# Patient Record
Sex: Male | Born: 1960 | Race: White | Hispanic: No | Marital: Married | Smoking: Never smoker
Health system: Southern US, Community
[De-identification: ages and names within clinical notes are randomized; demographics above are authoritative.]

## PROBLEM LIST (undated history)

## (undated) DIAGNOSIS — N400 Enlarged prostate without lower urinary tract symptoms: Secondary | ICD-10-CM

## (undated) DIAGNOSIS — R251 Tremor, unspecified: Secondary | ICD-10-CM

## (undated) DIAGNOSIS — K219 Gastro-esophageal reflux disease without esophagitis: Secondary | ICD-10-CM

## (undated) DIAGNOSIS — I10 Essential (primary) hypertension: Secondary | ICD-10-CM

## (undated) DIAGNOSIS — F419 Anxiety disorder, unspecified: Secondary | ICD-10-CM

## (undated) HISTORY — DX: Benign prostatic hyperplasia without lower urinary tract symptoms: N40.0

## (undated) HISTORY — DX: Gastro-esophageal reflux disease without esophagitis: K21.9

## (undated) HISTORY — DX: Anxiety disorder, unspecified: F41.9

## (undated) HISTORY — DX: Essential (primary) hypertension: I10

## (undated) HISTORY — DX: Tremor, unspecified: R25.1

---
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
## 2001-12-04 DIAGNOSIS — N401 Enlarged prostate with lower urinary tract symptoms: Secondary | ICD-10-CM | POA: Insufficient documentation

## 2004-05-05 DIAGNOSIS — I10 Essential (primary) hypertension: Secondary | ICD-10-CM | POA: Insufficient documentation

## 2005-11-20 ENCOUNTER — Ambulatory Visit: Payer: Self-pay | Admitting: Unknown Physician Specialty

## 2009-11-05 ENCOUNTER — Ambulatory Visit: Payer: Self-pay | Admitting: Unknown Physician Specialty

## 2009-11-05 LAB — HM COLONOSCOPY

## 2011-06-23 ENCOUNTER — Ambulatory Visit: Payer: Self-pay | Admitting: Family Medicine

## 2012-07-02 ENCOUNTER — Ambulatory Visit: Payer: Self-pay | Admitting: General Practice

## 2013-02-16 IMAGING — CR DG CHEST 1V
1 series · 1 of 1 positions shown · non-contrast
Comparison: none

REASON FOR EXAM: Positive QFG TB-Test Send report to [REDACTED]
[phone]
COMMENTS:

[pa]
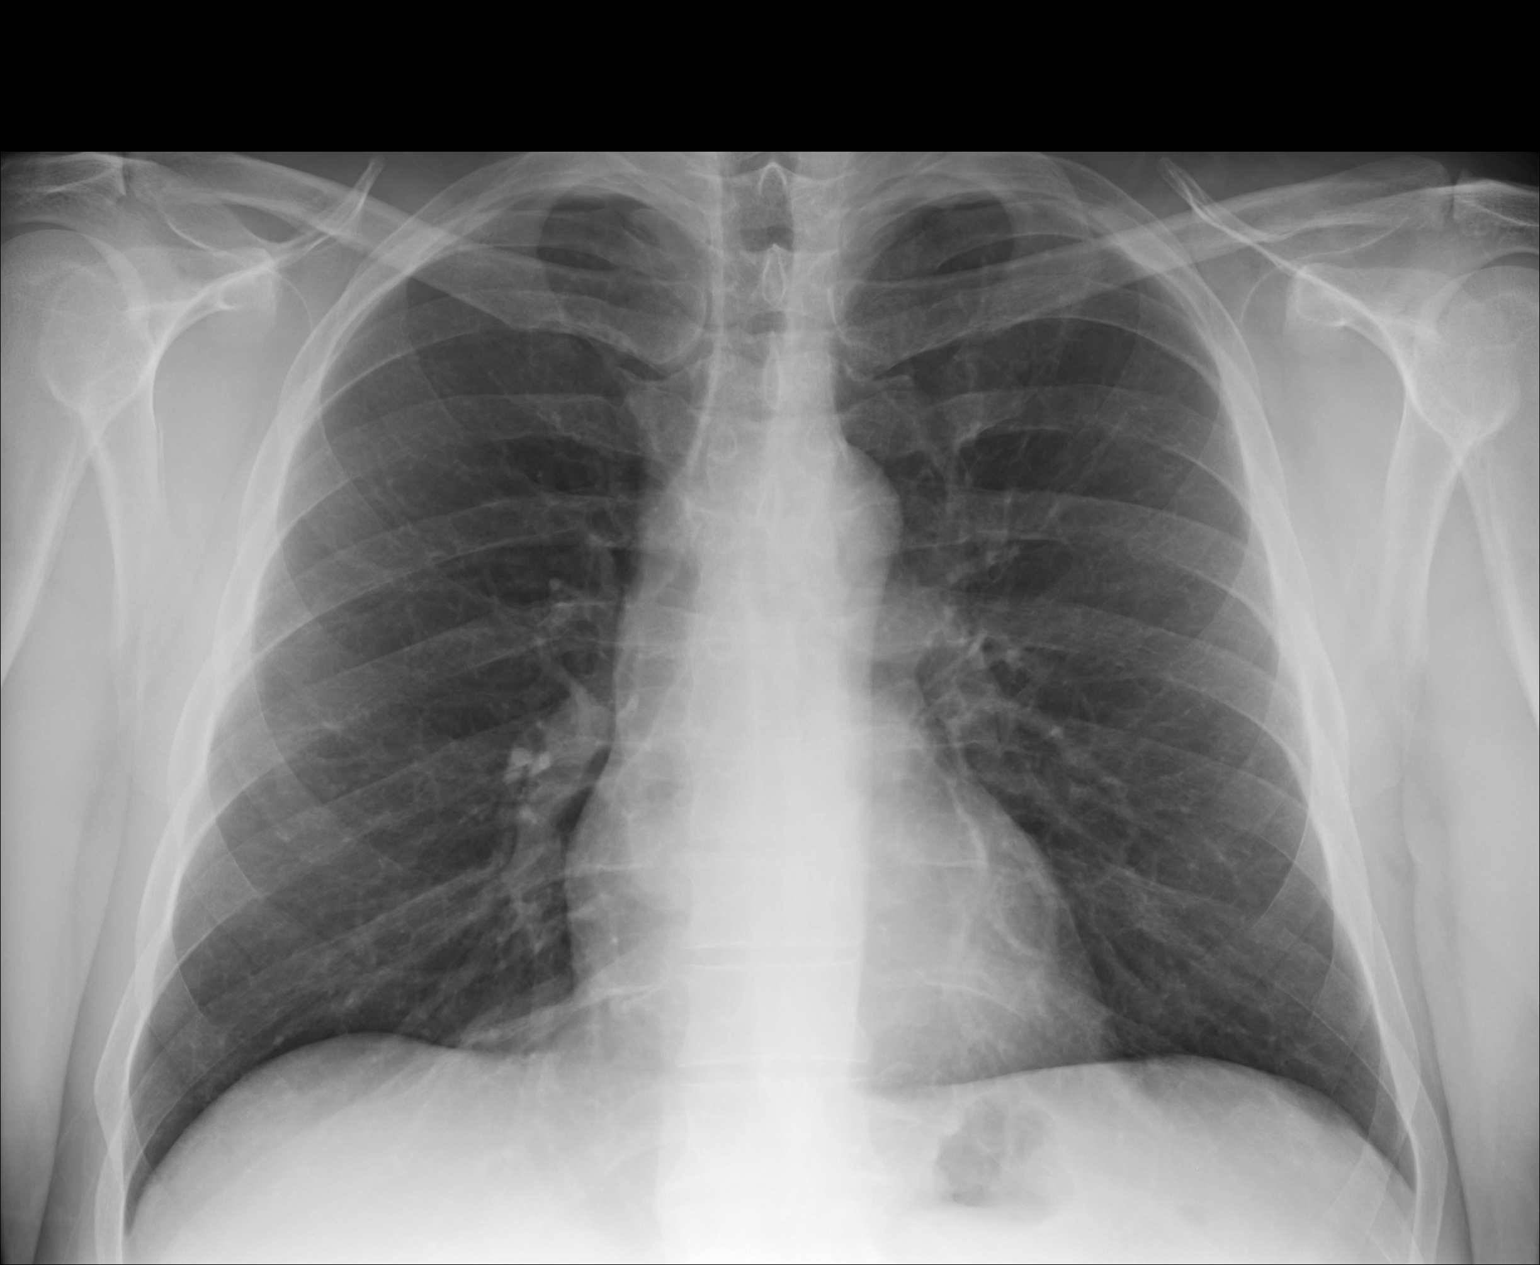

[1 of 1 positions shown; findings below may reference images not displayed]

PROCEDURE:     DXR - DXR CHEST 1 VIEWAP OR PA  - July 02, 2012 [DATE]

RESULT:     The lungs are well-expanded and clear. The cardiac silhouette is
normal in size. The mediastinum is normal in width. There is no pleural
effusion. No pulmonary parenchymal masses are demonstrated and there is no
evidence of calcified granulomas.
IMPRESSION: Normal chest x-ray.

[REDACTED]

## 2015-01-26 ENCOUNTER — Observation Stay: Payer: Self-pay | Admitting: Surgery

## 2015-01-26 HISTORY — PX: CHOLECYSTECTOMY: SHX55

## 2015-02-02 HISTORY — PX: GALLBLADDER SURGERY: SHX652

## 2015-02-12 LAB — LIPID PANEL
Cholesterol: 169 mg/dL (ref 0–200)
HDL: 41 mg/dL (ref 35–70)
LDL CALC: 104 mg/dL
TRIGLYCERIDES: 119 mg/dL (ref 40–160)

## 2015-02-12 LAB — PSA: PSA: 1.1

## 2015-03-29 LAB — SURGICAL PATHOLOGY

## 2015-04-04 NOTE — Op Note (Signed)
PATIENT NAME:  Ryan Mcmahon, [name] MR#:  [id number] DATE OF BIRTH:  1961-09-16  DATE OF PROCEDURE:  01/26/2015  PREOPERATIVE DIAGNOSIS: Acute cholecystitis.   POSTOPERATIVE DIAGNOSIS:  Acute cholecystitis.  PROCEDURE PERFORMED: Laparoscopic cholecystectomy.   ANESTHESIA: General.   ESTIMATED BLOOD LOSS: 15 mL's.    COMPLICATIONS: None.   SPECIMENS: Gallbladder.   INDICATION FOR SURGERY: Mr. Katrinka BlazingSmith is a pleasant 54 year old male who presents with right upper quadrant pain, leukocytosis and ultrasound suggests the cholecystitis. He was brought to the operating room for cholecystectomy.   DETAILS OF PROCEDURE: Appropriate consent was obtained and Mr. Katrinka BlazingSmith was brought to the operating room where he was induced, endotracheal tube was placed, general anesthesia was administered. His abdomen was prepped and draped in the standard surgical fashion. A timeout was then performed to correct identify the patient name, operative site and procedure to be performed. A supraumbilical incision was made and deepened down to the fascia. The fascia was incised. Peritoneum was entered. Two stay sutures were placed in the fasciotomy. A Hassan trocar was placed in the abdomen. The abdomen was insufflated under direct vision. An 11 mm epigastric and 2 right subcostal trocars were placed at the midclavicular and anterior axillary line. The gallbladder was then visualized. It was distended with inflammation. It had a few adhesions, however, was lifted over the dome of the liver. The cystic duct and cystic artery were dissected out so the critical view was obtained. These structures were clipped and ligated. The gallbladder was then taken off the gallbladder wall with electrocautery. There was a large amount of edema. The gallbladder was then taken out through the umbilical port with an Endo Catch bag. After I was happy with hemostasis, all trocars were removed under direct visualization. The supraumbilical trocar was closed with  a figure-of-8 and 0 Vicryl. All skin sites were then closed with 4-0 Monocryl. Deep dermal sutures, Steri-Strips, Telfa gauze and Tegaderm were used to complete the dressing. The patient was then awoken and brought to the postanesthesia care unit. There were no immediate complications. Needle, sponge, and instrument counts were correct at the end of the procedure.    ____________________________ Si Raiderhristopher A. Shelena Castelluccio, MD cal:mc D: 01/27/2015 22:02:00 ET T: 01/28/2015 12:44:28 ET JOB#: [id number]  cc: Cristal Deerhristopher A. Ryen Heitmeyer, MD, <Dictator> Jarvis NewcomerHRISTOPHER A Libi Corso MD ELECTRONICALLY SIGNED 02/09/2015 11:24

## 2015-04-04 NOTE — H&P (Signed)
Subjective/Chief Complaint epigastric and RUQ abdominal pain   History of Present Illness 54 y/o male with HTN presents with second episode of severe epigastric and RUQ abdominal pain radiating into his right back within the last week.  One week ago developed similar but not as intense pain around 10 pm lasting 2-3 hours which resolved on own and then he was asymptomatic for 1 week.  This happened following eating chicken casserole.  Last night around the same time developed similar type upper abdominal pain which was much more intense characterized by sharp pains and mild nausea.  Self induced emesis times one.  No sick contacts, no fevers, no jaundice.  He denies prior episodes of such pain.  No family history of gallstones.   Work-up in ER shows stones lodged in GB neck and positive murphy's sign.  Since admission feels better following IVF, IV abx and IV narcotics.   Only past surgical history is tonsillectomy.   Past History hypertension   Past Medical Health Hypertension   Code Status Full Code   Past Med/Surgical Hx:  Tonsillectomy:   ALLERGIES:  No Known Allergies:    Other Allergies none   HOME MEDICATIONS: Medication Instructions Status  verapamil 24 hour extended release 300 milligram(s) orally once a day Active  hydrochlorothiazide-lisinopril 12.5 mg-20 mg oral tablet 1 tab(s) orally once a day Active  meloxicam 15 mg oral tablet 1 tab(s) orally once a day Active  omeprazole 20 mg oral delayed release tablet 1 tab(s) orally once a day Active   Family and Social History:  Family History negative for gallstones   Social History positive  tobacco (Current within 1 year), positive ETOH, negative Illicit drugs, smokeless tobacco and beer.   + Tobacco Current (within 1 year)   Place of Living Home   Review of Systems:  Subjective/Chief Complaint see above, remaining 10 review is negative.   Abdominal Pain Yes   Nausea/Vomiting Yes   SOB/DOE No   Chest Pain  No   Tolerating Diet No  Nauseated   Medications/Allergies Reviewed Medications/Allergies reviewed   Physical Exam:  GEN no acute distress, disheveled, temp 94.5 p 68 bp 136/84 rr 16/minute.   HEENT pale conjunctivae, PERRL, hearing intact to voice   NECK supple  No masses   RESP normal resp effort  clear BS   CARD regular rate   ABD positive tenderness  no hernia  soft  tender RUQ and epigastrum.   LYMPH negative neck   EXTR negative cyanosis/clubbing   SKIN normal to palpation, No rashes   NEURO cranial nerves intact   PSYCH A+O to time, place, person, good insight   Lab Results: Hepatic:  23-Feb-16 00:31   Bilirubin, Total 0.3  Alkaline Phosphatase  120  SGPT (ALT) 25  SGOT (AST) 24  Total Protein, Serum 7.7  Albumin, Serum 4.1  Routine Chem:  23-Feb-16 00:31   Glucose, Serum  114  BUN 16  Creatinine (comp)  1.31  Sodium, Serum 141  Potassium, Serum 3.6  Chloride, Serum 104  CO2, Serum 31  Calcium (Total), Serum 8.7  Osmolality (calc) 283  eGFR (African American) >60  eGFR (Non-African American) >60 (eGFR values <37m/min/1.73 m2 may be an indication of chronic kidney disease (CKD). Calculated eGFR, using the MRDR Study equation, is useful in  patients with stable renal function. The eGFR calculation will not be reliable in acutely ill patients when serum creatinine is changing rapidly. It is not useful in patients on dialysis. The eGFR calculation  may not be applicable to patients at the low and high extremes of body sizes, pregnant women, and vegetarians.)  Anion Gap  6  Lipase 107 (Result(s) reported on 26 Jan 2015 at 01:13AM.)  Cardiac:  23-Feb-16 00:31   Troponin I < 0.02 (0.00-0.05 0.05 ng/mL or less: NEGATIVE  Repeat testing in 3-6 hrs  if clinically indicated. >0.05 ng/mL: POTENTIAL  MYOCARDIAL INJURY. Repeat  testing in 3-6 hrs if  clinically indicated. NOTE: An increase or decrease  of 30% or more on serial  testing suggests a   clinically important change)  Routine Hem:  23-Feb-16 00:31   WBC (CBC) 10.3  RBC (CBC) 4.86  Hemoglobin (CBC) 15.3  Hematocrit (CBC) 45.2  Platelet Count (CBC) 251  MCV 93  MCH 31.6  MCHC 33.9  RDW 13.0  Neutrophil % 56.1  Lymphocyte % 32.3  Monocyte % 9.0  Eosinophil % 1.9  Basophil % 0.7  Neutrophil # 5.8  Lymphocyte # 3.3  Monocyte # 0.9  Eosinophil # 0.2  Basophil # 0.1 (Result(s) reported on 26 Jan 2015 at 01:04AM.)   Radiology Results: [place]:    23-Feb-16 02:09, US Abdomen Limited Survey  US Abdomen Limited Survey  REASON FOR EXAM:    pain, epigastic.  last week had similar in RUQ.    Nausea.  COMMENTS:   Body Site: Gallbladder, Liver, Common Bile Duct    PROCEDURE: [place]  - US ABDOMEN LIMITED SURVEY  - Jan 26 2015  2:09AM     CLINICAL DATA:  Epigastric pain.  Nausea.  Similar pain last week.    EXAM:  US ABDOMEN LIMITED - RIGHT UPPER QUADRANT    COMPARISON:  None.    FINDINGS:  Gallbladder:  Joaquim Lai appears lodged in the gallbladder neck. Sludge and mild  distention of the gallbladder. Positive Murphy's sign. Findings are  consistent with acute cholecystitis in the appropriate clinical  setting.    Common bile duct:    Diameter: 4.5 mm, normal    Liver:    No focal lesion identified. Within normal limits in parenchymal  echogenicity.     IMPRESSION:  Cholelithiasis and sludge with positive Murphy's sign. Findings  consistent with acute cholecystitis.      Electronically Signed    By: Lucienne Capers M.D.    On: 01/26/2015 02:13         Verified By: Neale Burly, M.D.,  LabUnknown:  PACS Image    Assessment/Admission Diagnosis 54 y/o male with HTN, cholelithiasis and acute calculus cholecystitits Currently feels better but still with significant discomfort   Plan Admit, IV abx, IV pain meds and anti-emetics. Discussed with him laparoscopic cholecystectomy with risks of open procedure, ERCP, bile duct injury (1:200), bleeding, bowel  injury and infection.  Wishes to proceed with surgery Plan later today pending OR schedule availability. All questions addressed.  Will discuss with Drs. Marterre and [employer].   Electronic Signatures: Sherri Rad (MD)  (Signed [phone] 07:14)  Authored: CHIEF COMPLAINT and HISTORY, PAST MEDICAL/SURGIAL HISTORY, ALLERGIES, Other Allergies, HOME MEDICATIONS, FAMILY AND SOCIAL HISTORY, REVIEW OF SYSTEMS, PHYSICAL EXAM, LABS, Radiology, ASSESSMENT AND PLAN   Last Updated: 23-Feb-16 07:14 by Sherri Rad (MD)

## 2015-11-05 ENCOUNTER — Other Ambulatory Visit: Payer: Self-pay | Admitting: Family Medicine

## 2015-11-05 NOTE — Telephone Encounter (Signed)
Please call in alprazolam.  

## 2015-11-05 NOTE — Telephone Encounter (Signed)
[facility]

## 2016-02-03 ENCOUNTER — Other Ambulatory Visit: Payer: Self-pay | Admitting: Family Medicine

## 2016-02-03 DIAGNOSIS — M542 Cervicalgia: Secondary | ICD-10-CM | POA: Insufficient documentation

## 2016-02-03 DIAGNOSIS — E669 Obesity, unspecified: Secondary | ICD-10-CM | POA: Insufficient documentation

## 2016-02-03 DIAGNOSIS — G250 Essential tremor: Secondary | ICD-10-CM | POA: Insufficient documentation

## 2016-02-03 DIAGNOSIS — R202 Paresthesia of skin: Secondary | ICD-10-CM | POA: Insufficient documentation

## 2016-02-03 DIAGNOSIS — N529 Male erectile dysfunction, unspecified: Secondary | ICD-10-CM | POA: Insufficient documentation

## 2016-02-03 DIAGNOSIS — E291 Testicular hypofunction: Secondary | ICD-10-CM | POA: Insufficient documentation

## 2016-02-08 ENCOUNTER — Ambulatory Visit
Admission: RE | Admit: 2016-02-08 | Discharge: 2016-02-08 | Disposition: A | Discharge status: Home / Self Care | Payer: Managed Care, Other (non HMO) | Source: Ambulatory Visit | Attending: Family Medicine | Admitting: Family Medicine

## 2016-02-08 ENCOUNTER — Ambulatory Visit (INDEPENDENT_AMBULATORY_CARE_PROVIDER_SITE_OTHER): Payer: Managed Care, Other (non HMO) | Admitting: Family Medicine

## 2016-02-08 ENCOUNTER — Encounter: Payer: Self-pay | Admitting: Family Medicine

## 2016-02-08 VITALS — BP 130/76 | HR 74 | Temp 98.2°F | Resp 16 | Ht 73.0 in | Wt 227.0 lb

## 2016-02-08 DIAGNOSIS — Z0000 Encounter for general adult medical examination without abnormal findings: Secondary | ICD-10-CM

## 2016-02-08 DIAGNOSIS — I10 Essential (primary) hypertension: Secondary | ICD-10-CM | POA: Diagnosis not present

## 2016-02-08 DIAGNOSIS — L570 Actinic keratosis: Secondary | ICD-10-CM | POA: Diagnosis not present

## 2016-02-08 DIAGNOSIS — M25572 Pain in left ankle and joints of left foot: Secondary | ICD-10-CM

## 2016-02-08 DIAGNOSIS — G250 Essential tremor: Secondary | ICD-10-CM | POA: Diagnosis not present

## 2016-02-08 DIAGNOSIS — R739 Hyperglycemia, unspecified: Secondary | ICD-10-CM

## 2016-02-08 MED ORDER — VERAPAMIL HCL ER 180 MG PO CP24: 180.0000 mg | ORAL_CAPSULE | Freq: Every day | ORAL | Status: DC

## 2016-02-08 MED ORDER — PROPRANOLOL HCL ER 80 MG PO CP24: 80.0000 mg | ORAL_CAPSULE | Freq: Every day | ORAL | Status: DC

## 2016-02-08 NOTE — Patient Instructions (Addendum)
Please schedule a follow up visit with your dermatologist in the near future.   Go to the [facility] on [address] for left ankle Xray

## 2016-02-08 NOTE — Progress Notes (Signed)
Patient: Ryan Mcmahon, Male    DOB: Jan 14, 1961, 55 y.o.   MRN: [id number] Visit Date: 02/08/2016  Today's Provider: Mila Merry, MD   Chief Complaint  Patient presents with  . Annual Exam  . Hypertension  . Anxiety   Subjective:    Annual physical exam Ryan Mcmahon is a 55 y.o. male who presents today for health maintenance and complete physical. He feels fairly well. He reports exercising some. He reports he is sleeping fairly well.  -----------------------------------------------------------------    Follow-up for anxiety from 02/02/2015. Taking occasional alprazolam which works well when he does take it    Hypertension, follow-up:  BP Readings from Last 3 Encounters:  02/08/16 130/76  02/02/15 122/80    He was last seen for hypertension 1 years ago.  BP at that visit was 122/80. Management since that visit includes; no changes.He reports good compliance with treatment. He is not having side effects. none  He is exercising. He is adherent to low salt diet.   Outside blood pressures are 130/75. He is experiencing none.  Patient denies none.   Cardiovascular risk factors include none.  Use of agents associated with hypertension: none.   ----------------------------------------------------------------------   Ankle pain He started having pain and swelling left lateral ankle about 6 months ago. He thinks he may have strained it playing golf, but doesn't remember a specific incident. It has improved, but still flares up periodically and bother's him when he walks for long periods or golfs. He occasionally wears and elastic ankle brace, but does not use it consistently.    Essential tremor He has had mild tremor with use of hands for several years which runs in his family. There is no tremor at rest. Was prescribed mysoline several years but he doesn't remember how it worked. He would like to try propranolol as tremor is getting a little worse with time. .    Review of Systems  Constitutional: Negative.   HENT: Negative.   Eyes: Negative.   Respiratory: Negative.   Cardiovascular: Negative.   Gastrointestinal: Negative.   Endocrine: Negative.   Genitourinary: Negative.   Musculoskeletal:       Right foot/ankle pain 6 months ago  Skin: Negative.   Allergic/Immunologic: Negative.   Neurological: Positive for tremors.  Psychiatric/Behavioral: The patient is nervous/anxious.     Social History      He  reports that he has never smoked. His smokeless tobacco use includes Chew. He reports that he drinks alcohol. He reports that he does not use illicit drugs.       Social History   Social History  . Marital Status: Married    Spouse Name: N/A  . Number of Children: 2  . Years of Education: N/A   Social History Main Topics  . Smoking status: Never Smoker   . Smokeless tobacco: Current User    Types: Chew  . Alcohol Use: 0.0 oz/week    0 Standard drinks or equivalent per week     Comment: occasional use  . Drug Use: No  . Sexual Activity: Not Asked   Other Topics Concern  . None   Social History Narrative    History reviewed. No pertinent past medical history.   Patient Active Problem List   Diagnosis Date Noted  . 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 Hypogonadism male 02/03/2016  . Left hand paresthesia 02/03/2016  . Neck pain 02/03/2016  . Obesity 02/03/2016  . Benign  essential tremor 02/03/2016  . Anxiety disorder 01/22/2008  . Essential (primary) hypertension 05/05/2004  . GERD (gastroesophageal reflux disease) 05/05/2002  . Benign prostatic hypertrophy without urinary obstruction 12/04/2001    Past Surgical History  Procedure Laterality Date  . Cholecystectomy  01/26/2015    [facility]; Dr. Brigid Re  . Tonsillectomy and adenoidectomy  1960's  . Gallbladder surgery  3/16    Family History        Family Status  Relation Status Death Age  . Mother Alive   . Father Deceased 56's    Died  from Huntingtons Disease  . Sister Alive   . Other Deceased         His family history includes Heart attack in his other; Huntington's disease in his father.    Allergies  Allergen Reactions  . Effexor Xr  [Venlafaxine]   . Tarka  [Trandolapril-Verapamil Hcl Er]     Other reaction(s): Constipation, Nausea    Previous Medications   ALPRAZOLAM (XANAX) 0.25 MG TABLET    take 1 tablet by mouth every 8 hours if needed   LISINOPRIL-HYDROCHLOROTHIAZIDE (PRINZIDE,ZESTORETIC) 20-12.5 MG TABLET    take 1 tablet by mouth once daily   OMEPRAZOLE (PRILOSEC OTC) 20 MG TABLET    Take 1 tablet by mouth daily.   VERAPAMIL HCL CR 300 MG CP24    take 1 capsule by mouth once daily    Patient Care Team: Malva Limes, MD as PCP - General (Family Medicine)     Objective:   Vitals: BP 130/76 mmHg  Pulse 74  Temp(Src) 98.2 F (36.8 C) (Oral)  Resp 16  Ht  (1.854 m)  Wt 227 lb (102.967 kg)  BMI 29.96 kg/m2  SpO2 98%   Physical Exam   General Appearance:    Alert, cooperative, no distress, appears stated age  Head:    Normocephalic, without obvious abnormality, atraumatic  Eyes:    PERRL, conjunctiva/corneas clear, EOM's intact, fundi    benign, both eyes       Ears:    Normal TM's and external ear canals, both ears  Nose:   Nares normal, septum midline, mucosa normal, no drainage   or sinus tenderness  Throat:   Lips, mucosa, and tongue normal; teeth and gums normal  Neck:   Supple, symmetrical, trachea midline, no adenopathy;       thyroid:  No enlargement/tenderness/nodules; no carotid   bruit or JVD  Back:     Symmetric, no curvature, ROM normal, no CVA tenderness  Lungs:     Clear to auscultation bilaterally, respirations unlabored  Chest wall:    No tenderness or deformity  Heart:    Regular rate and rhythm, S1 and S2 normal, no murmur, rub   or gallop  Abdomen:     Soft, non-tender, bowel sounds active all four quadrants,    no masses, no organomegaly  Genitalia:     deferred  Rectal:    deferred  Extremities:   Extremities normal, atraumatic, no cyanosis or edema. Mild tenderness and slight swelling over left talo-fibular ligament. Stable ankle joint. No erythema.   Pulses:   2+ and symmetric all extremities  Skin:   Skin color, texture, turgor normal, no rashes. Several actinic keratoses.   Lymph nodes:   Cervical, supraclavicular, and axillary nodes normal  Neurologic:   CNII-XII intact. Normal strength, sensation and reflexes      Throughout. Mild resting tremor. No cogwheeling. Normal gait.     Depression Screen [facility] 2/9  Scores 02/08/2016  PHQ - 2 Score 0  PHQ- 9 Score 3      Assessment & Plan:     Routine Health Maintenance and Physical Exam  Exercise Activities and Dietary recommendations Goals    None      Immunization History  Administered Date(s) Administered  . Influenza-Unspecified 10/05/2015  . Td 12/04/2001  . Tdap 07/20/2009    Health Maintenance  Topic Date Due  . HIV Screening  04/22/1974  . INFLUENZA VACCINE  07/05/2015  . TETANUS/TDAP  07/21/2019  . COLONOSCOPY  11/06/2019  . Hepatitis C Screening  Completed      Discussed health benefits of physical activity, and encouraged him to engage in regular exercise appropriate for his age and condition.    --------------------------------------------------------------------   1. Annual physical exam  - Lipid panel - PSA - Comprehensive metabolic panel  2. Essential (primary) hypertension Reduce Verapamil to 180mg  daily since we are adding propranolol for tremors as below. Follow up for BP check in a month  3. Benign essential tremor Start propranolol ER 80 daily  - EKG 12-Lead  4. Hyperglycemia  - Hemoglobin A1C  5. Left ankle pain Likely TF sprain. Advised he should wear ankle support brace or wrap at all times when ambulating. If not greatly improved in 3 weeks he is to call for referral to orthopedist - DG Ankle Complete Left; Future  6. Actinic  keratosis He is overdue to see Dr. Gwen PoundsKowalski and states he will call to make an appointment in the near future.

## 2016-02-12 LAB — COMPREHENSIVE METABOLIC PANEL
A/G RATIO: 2 (ref 1.1–2.5)
ALBUMIN: 4.4 g/dL (ref 3.5–5.5)
ALT: 19 IU/L (ref 0–44)
AST: 15 IU/L (ref 0–40)
Alkaline Phosphatase: 83 IU/L (ref 39–117)
BILIRUBIN TOTAL: 0.5 mg/dL (ref 0.0–1.2)
BUN / CREAT RATIO: 12 (ref 9–20)
BUN: 11 mg/dL (ref 6–24)
CHLORIDE: 101 mmol/L (ref 96–106)
CO2: 21 mmol/L (ref 18–29)
Calcium: 9.1 mg/dL (ref 8.7–10.2)
Creatinine, Ser: 0.94 mg/dL (ref 0.76–1.27)
GFR calc non Af Amer: 90 mL/min/{1.73_m2} (ref >59)
GFR, EST AFRICAN AMERICAN: 104 mL/min/{1.73_m2} (ref >59)
Globulin, Total: 2.2 g/dL (ref 1.5–4.5)
Glucose: 108 mg/dL — ABNORMAL HIGH (ref 65–99)
POTASSIUM: 4.2 mmol/L (ref 3.5–5.2)
Sodium: 138 mmol/L (ref 134–144)
TOTAL PROTEIN: 6.6 g/dL (ref 6.0–8.5)

## 2016-02-12 LAB — LIPID PANEL
CHOLESTEROL TOTAL: 183 mg/dL (ref 100–199)
Chol/HDL Ratio: 5.4 ratio units — ABNORMAL HIGH (ref 0.0–5.0)
HDL: 34 mg/dL — ABNORMAL LOW (ref >39)
LDL CALC: 122 mg/dL — AB (ref 0–99)
TRIGLYCERIDES: 133 mg/dL (ref 0–149)
VLDL CHOLESTEROL CAL: 27 mg/dL (ref 5–40)

## 2016-02-12 LAB — HEMOGLOBIN A1C
Est. average glucose Bld gHb Est-mCnc: 117 mg/dL
Hgb A1c MFr Bld: 5.7 % — ABNORMAL HIGH (ref 4.8–5.6)

## 2016-02-12 LAB — PSA: Prostate Specific Ag, Serum: 1.4 ng/mL (ref 0.0–4.0)

## 2016-04-11 ENCOUNTER — Other Ambulatory Visit: Payer: Self-pay | Admitting: Family Medicine

## 2016-05-14 ENCOUNTER — Other Ambulatory Visit: Payer: Self-pay | Admitting: Family Medicine

## 2016-05-17 NOTE — Telephone Encounter (Signed)
Rx called in to pharmacy. 

## 2016-05-17 NOTE — Telephone Encounter (Signed)
Please call in alprazolam.  

## 2016-05-18 ENCOUNTER — Encounter: Payer: Self-pay | Admitting: Physician Assistant

## 2016-05-18 ENCOUNTER — Ambulatory Visit (INDEPENDENT_AMBULATORY_CARE_PROVIDER_SITE_OTHER): Payer: Managed Care, Other (non HMO) | Admitting: Physician Assistant

## 2016-05-18 VITALS — BP 132/80 | HR 60 | Temp 97.7°F | Resp 16 | Wt 227.8 lb

## 2016-05-18 DIAGNOSIS — L259 Unspecified contact dermatitis, unspecified cause: Secondary | ICD-10-CM

## 2016-05-18 MED ORDER — TRIAMCINOLONE ACETONIDE 0.1 % EX CREA: 1.0000 "application " | TOPICAL_CREAM | Freq: Two times a day (BID) | CUTANEOUS | Status: DC

## 2016-05-18 MED ORDER — PREDNISONE 10 MG (21) PO TBPK: ORAL_TABLET | ORAL | Status: DC

## 2016-05-18 NOTE — Progress Notes (Signed)
       Patient: Ryan Mcmahon Male    DOB: [birth year]   55 y.o.   MRN: [id number] Visit Date: 05/18/2016  Today's Provider: Margaretann LovelessJennifer M Burnette, PA-C   Chief Complaint  Patient presents with  . Rash   Subjective:    Rash This is a new problem. The current episode started in the past 7 days (Saturday he was working in the yard at his house). The problem is unchanged. The affected locations include the left arm, left toes, right toes, left hip, left axilla, right fingers and left fingers (Mainly on the right arm and fingers). The rash is characterized by redness, itchiness and blistering. He was exposed to plant contact. Pertinent negatives include no fatigue, fever, joint pain or shortness of breath. Treatments tried: baking soda and vinegar and calamine lotion. The treatment provided no relief.       Allergies  Allergen Reactions  . Effexor Xr  [Venlafaxine]   . Tarka  [Trandolapril-Verapamil Hcl Er]     Other reaction(s): Constipation, Nausea   Current Meds  Medication Sig  . ALPRAZolam (XANAX) 0.25 MG tablet take 1 tablet by mouth every 8 hours if needed  . lisinopril-hydrochlorothiazide (PRINZIDE,ZESTORETIC) 20-12.5 MG tablet take 1 tablet by mouth once daily  . omeprazole (PRILOSEC OTC) 20 MG tablet Take 1 tablet by mouth daily.  . propranolol ER (INDERAL LA) 80 MG 24 hr capsule take 1 capsule by mouth once daily  . verapamil (VERELAN PM) 180 MG 24 hr capsule Take 1 capsule (180 mg total) by mouth at bedtime.    Review of Systems  Constitutional: Negative for fever, chills and fatigue.  HENT: Negative.   Respiratory: Negative for shortness of breath.   Cardiovascular: Negative for chest pain, palpitations and leg swelling.  Musculoskeletal: Negative for joint pain.  Skin: Positive for rash.    Social History  Substance Use Topics  . Smoking status: Never Smoker   . Smokeless tobacco: Current User    Types: Chew  . Alcohol Use: 0.0 oz/week    0 Standard drinks or  equivalent per week     Comment: occasional use   Objective:   BP 132/80 mmHg  Pulse 60  Temp(Src) 97.7 F (36.5 C) (Oral)  Resp 16  Wt 227 lb 12.8 oz (103.329 kg)  Physical Exam  Constitutional: He appears well-developed and well-nourished. No distress.  HENT:  Head: Normocephalic and atraumatic.  Cardiovascular: Normal rate, regular rhythm and normal heart sounds.  Exam reveals no gallop and no friction rub.   No murmur heard. Pulmonary/Chest: Effort normal and breath sounds normal. No respiratory distress. He has no wheezes. He has no rales.  Skin: Rash noted. Rash is vesicular. He is not diaphoretic.     Vitals reviewed.      Assessment & Plan:     1. Contact dermatitis Most likely from poison ivy. Will give steroid taper and triamcinolone as below. He is to call if no improvement. - predniSONE (STERAPRED UNI-PAK 21 TAB) 10 MG (21) TBPK tablet; Take as directed on package directions.  Dispense: 21 tablet; Refill: 0 - triamcinolone cream (KENALOG) 0.1 %; Apply 1 application topically 2 (two) times daily.  Dispense: 30 g; Refill: 0       Margaretann LovelessJennifer M Burnette, PA-C  [facility]

## 2016-05-18 NOTE — Patient Instructions (Signed)

## 2016-06-19 ENCOUNTER — Ambulatory Visit (INDEPENDENT_AMBULATORY_CARE_PROVIDER_SITE_OTHER): Payer: Managed Care, Other (non HMO) | Admitting: Family Medicine

## 2016-06-19 ENCOUNTER — Encounter: Payer: Self-pay | Admitting: Family Medicine

## 2016-06-19 VITALS — BP 118/80 | HR 51 | Temp 98.2°F | Resp 16 | Wt 227.0 lb

## 2016-06-19 DIAGNOSIS — I10 Essential (primary) hypertension: Secondary | ICD-10-CM

## 2016-06-19 DIAGNOSIS — G250 Essential tremor: Secondary | ICD-10-CM

## 2016-06-19 MED ORDER — VERAPAMIL HCL ER 100 MG PO CP24: 100.0000 mg | ORAL_CAPSULE | Freq: Every day | ORAL | Status: DC

## 2016-06-19 MED ORDER — PROPRANOLOL HCL ER 120 MG PO CP24: 120.0000 mg | ORAL_CAPSULE | Freq: Every day | ORAL | Status: DC

## 2016-06-19 NOTE — Progress Notes (Signed)
Patient: Ryan Mcmahon Male    DOB: 1961/08/02   55 y.o.   MRN: [id number] Visit Date: 06/19/2016  Today's Provider: Mila Merryonald Fisher, MD   Chief Complaint  Patient presents with  . Hypertension    follow up  . Tremors    follow up   Subjective:    HPI  Hypertension, follow-up:  BP Readings from Last 3 Encounters:  05/18/16 132/80  02/08/16 130/76  02/02/15 122/80    He was last seen for hypertension 4 months ago.  BP at that visit was 130/76. Management since that visit includes decreased Verapamil, since we were adding Propanolol for Tremors. He reports good compliance with treatment. He is not having side effects.  He is exercising. He is adherent to low salt diet.   Outside blood pressures are 124/85. He is experiencing none.  Patient denies chest pain, chest pressure/discomfort, claudication, dyspnea, exertional chest pressure/discomfort, fatigue, irregular heart beat, lower extremity edema, near-syncope, orthopnea, palpitations, paroxysmal nocturnal dyspnea, syncope and tachypnea.   Cardiovascular risk factors include hypertension and male gender.  Use of agents associated with hypertension: none.     Weight trend: stable Wt Readings from Last 3 Encounters:  05/18/16 227 lb 12.8 oz (103.329 kg)  02/08/16 227 lb (102.967 kg)  02/02/15 217 lb (98.431 kg)    Current diet: in general, an "unhealthy" diet  ------------------------------------------------------------------------  Follow up Benign Essential Tremors: Patient was last seen for this problem 4 months ago. Changes made during that visit includes starting Propanolol ER 80mg  daily. Patient reports good compliance with treatment, good tolerance and fair symptom control.  Patient feels that his Tremors have improved since starting Propranolol.    Allergies  Allergen Reactions  . Effexor Xr  [Venlafaxine]   . Tarka  [Trandolapril-Verapamil Hcl Er]     Other reaction(s): Constipation, Nausea    Current Meds  Medication Sig  . ALPRAZolam (XANAX) 0.25 MG tablet take 1 tablet by mouth every 8 hours if needed  . lisinopril-hydrochlorothiazide (PRINZIDE,ZESTORETIC) 20-12.5 MG tablet take 1 tablet by mouth once daily  . omeprazole (PRILOSEC OTC) 20 MG tablet Take 1 tablet by mouth daily.  . propranolol ER (INDERAL LA) 80 MG 24 hr capsule take 1 capsule by mouth once daily  . verapamil (CALAN-SR) 180 MG CR tablet take 1 tablet by mouth at bedtime  . verapamil (VERELAN PM) 180 MG 24 hr capsule Take 1 capsule (180 mg total) by mouth at bedtime.    Review of Systems  Constitutional: Negative for fever, chills, appetite change and fatigue.  Respiratory: Negative for chest tightness, shortness of breath and wheezing.   Cardiovascular: Negative for chest pain and palpitations.  Gastrointestinal: Negative for nausea, vomiting and abdominal pain.  Neurological: Positive for tremors.    Social History  Substance Use Topics  . Smoking status: Never Smoker   . Smokeless tobacco: Current User    Types: Chew  . Alcohol Use: 0.0 oz/week    0 Standard drinks or equivalent per week     Comment: occasional use   Objective:   BP 118/80 mmHg  Pulse 51  Temp(Src) 98.2 F (36.8 C) (Oral)  Resp 16  Wt 227 lb (102.967 kg)  SpO2 98%  Physical Exam   General Appearance:    Alert, cooperative, no distress  Eyes:    PERRL, conjunctiva/corneas clear, EOM's intact       Lungs:     Clear to auscultation bilaterally, respirations unlabored  Heart:  Regular rate and rhythm  Neurologic:   Awake, alert, oriented x 3. No apparent focal neurological           defect.           Assessment & Plan:     1. Benign essential tremor Seems to be responding well to propranolol, but tremor is still bothersome. He would like to go to higher dose - propranolol ER (INDERAL LA) 120 MG 24 hr capsule; Take 1 capsule (120 mg total) by mouth daily.  Dispense: 30 capsule; Refill: 3  2. Essential (primary)  hypertension Well controlled.  Will reduce verapamil to compensate for increased dose of propranolol. Consider stopping CCB all together if BP remains well controlled. Continue current dose of lisinopril-hctz.  - verapamil (VERELAN) 100 MG 24 hr capsule; Take 1 capsule (100 mg total) by mouth at bedtime.  Dispense: 30 capsule; Refill: 3  Return in about 3 months (around 09/19/2016).  The entirety of the information documented in the History of Present Illness, Review of Systems and Physical Exam were personally obtained by me. Portions of this information were initially documented by Awilda Bill, CMA and reviewed by me for thoroughness and accuracy.        Mila Merry, MD  [facility]

## 2016-08-17 ENCOUNTER — Other Ambulatory Visit: Payer: Self-pay | Admitting: Family Medicine

## 2016-10-21 ENCOUNTER — Other Ambulatory Visit: Payer: Self-pay | Admitting: Family Medicine

## 2016-10-21 DIAGNOSIS — G250 Essential tremor: Secondary | ICD-10-CM

## 2016-10-21 DIAGNOSIS — I10 Essential (primary) hypertension: Secondary | ICD-10-CM

## 2016-12-28 ENCOUNTER — Other Ambulatory Visit: Payer: Self-pay | Admitting: Family Medicine

## 2016-12-29 NOTE — Telephone Encounter (Signed)
Please call in alprazolam.  

## 2016-12-29 NOTE — Telephone Encounter (Signed)
rx called in-aa 

## 2017-01-27 ENCOUNTER — Encounter: Payer: Self-pay | Admitting: Family Medicine

## 2017-02-12 ENCOUNTER — Encounter: Payer: Self-pay | Admitting: Family Medicine

## 2017-02-14 ENCOUNTER — Encounter: Payer: Self-pay | Admitting: Family Medicine

## 2017-02-14 ENCOUNTER — Ambulatory Visit (INDEPENDENT_AMBULATORY_CARE_PROVIDER_SITE_OTHER): Payer: Managed Care, Other (non HMO) | Admitting: Family Medicine

## 2017-02-14 VITALS — BP 130/80 | HR 55 | Temp 98.1°F | Resp 16 | Ht 73.0 in | Wt 230.0 lb

## 2017-02-14 DIAGNOSIS — F419 Anxiety disorder, unspecified: Secondary | ICD-10-CM

## 2017-02-14 DIAGNOSIS — G250 Essential tremor: Secondary | ICD-10-CM

## 2017-02-14 DIAGNOSIS — Z720 Tobacco use: Secondary | ICD-10-CM

## 2017-02-14 DIAGNOSIS — R739 Hyperglycemia, unspecified: Secondary | ICD-10-CM

## 2017-02-14 DIAGNOSIS — R059 Cough, unspecified: Secondary | ICD-10-CM

## 2017-02-14 DIAGNOSIS — R05 Cough: Secondary | ICD-10-CM | POA: Diagnosis not present

## 2017-02-14 DIAGNOSIS — I10 Essential (primary) hypertension: Secondary | ICD-10-CM

## 2017-02-14 DIAGNOSIS — Z125 Encounter for screening for malignant neoplasm of prostate: Secondary | ICD-10-CM

## 2017-02-14 DIAGNOSIS — Z0000 Encounter for general adult medical examination without abnormal findings: Secondary | ICD-10-CM

## 2017-02-14 MED ORDER — BUDESONIDE-FORMOTEROL FUMARATE 160-4.5 MCG/ACT IN AERO: 2.0000 | INHALATION_SPRAY | Freq: Two times a day (BID) | RESPIRATORY_TRACT | 12 refills | Status: DC

## 2017-02-14 NOTE — Progress Notes (Signed)
Patient: Ryan Mcmahon, Male    DOB: Aug 02, 1961, 56 y.o.   MRN: [id number] Visit Date: 02/14/2017  Today's Provider: Mila Merry, MD   Chief Complaint  Patient presents with  . Annual Exam  . Hypertension  . Hyperglycemia  . Tremors   Subjective:    Annual physical exam Ryan Mcmahon is a 56 y.o. male who presents today for health maintenance and complete physical. He feels well. He reports exercising yes/walking. He reports he is sleeping fairly well.  Wt Readings from Last 3 Encounters:  02/14/17 230 lb (104.3 kg)  06/19/16 227 lb (103 kg)  05/18/16 227 lb 12.8 oz (103.3 kg)    ----------------------------------------------------------------    Hypertension, follow-up:  BP Readings from Last 3 Encounters:  02/14/17 130/80  06/19/16 118/80  05/18/16 132/80    He was last seen for hypertension 8 months ago.  BP at that visit was 118/80. Management since that visit includes; decreased verapamil to compensate for increased dose of lisinopril/HCTZ.He reports good compliance with treatment. Has since stopped verapamil since his BP was feeling fatigued.  He is not having side effects. none He is exercising. He is adherent to low salt diet.   Outside blood pressures are 130/80. He is experiencing none.  Patient denies none.   Cardiovascular risk factors include none.  Use of agents associated with hypertension: none.   ----------------------------------------------------------------  Benign essential tremor From 06/19/2016-increased propranolol ER to 120 MG. Patient was advised that he could stop verapamil for the time being.   Hyperglycemia From 02/08/2016-labs checked,, no changes. Last A1c was 02/11/2016-5.7.  Follow up anxiety,   Still takes occasionally alprazolam, usually only takes half a pill every couple of days.    Complains of cough for last few weeks, initially associated with fevers which have resolved. No dyspnea. Had old Z-pack which he  took. Had similar sx in 2015 and did well with Symbicort. . Is productive clear mucous  Review of Systems  Respiratory: Positive for cough.   Neurological: Positive for tremors.  Psychiatric/Behavioral: The patient is nervous/anxious.   All other systems reviewed and are negative.   Social History      He  reports that he has never smoked. His smokeless tobacco use includes Chew. He reports that he drinks alcohol. He reports that he does not use drugs.       Social History   Social History  . Marital status: Married    Spouse name: N/A  . Number of children: 2  . Years of education: N/A   Social History Main Topics  . Smoking status: Never Smoker  . Smokeless tobacco: Current User    Types: Chew  . Alcohol use 0.0 oz/week     Comment: occasional use  . Drug use: No  . Sexual activity: Not Asked   Other Topics Concern  . None   Social History Narrative  . None    History reviewed. No pertinent past medical history.   Patient Active Problem List   Diagnosis Date Noted  . Actinic keratosis 02/08/2016  . 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 Hypogonadism male 02/03/2016  . Left hand paresthesia 02/03/2016  . Neck pain 02/03/2016  . Obesity 02/03/2016  . Benign essential tremor 02/03/2016  . Anxiety disorder 01/22/2008  . Essential (primary) hypertension 05/05/2004  . GERD (gastroesophageal reflux disease) 05/05/2002  . Benign prostatic hypertrophy without urinary obstruction 12/04/2001    Past Surgical History:  Procedure Laterality  Date  . CHOLECYSTECTOMY  01/26/2015   [facility]; Dr. Brigid Mcmahon  . GALLBLADDER SURGERY  3/16  . TONSILLECTOMY AND ADENOIDECTOMY  1960's    Family History        Family Status  Relation Status  . Mother Alive  . Father Deceased at age 58's   Died from Huntingtons Disease  . Sister Alive  . Other Deceased        His family history includes Heart attack in his other; Huntington's disease in his father.      Allergies  Allergen Reactions  . Effexor Xr  [Venlafaxine]   . Tarka  [Trandolapril-Verapamil Hcl Er]     Other reaction(s): Constipation, Nausea     Current Outpatient Prescriptions:  .  ALPRAZolam (XANAX) 0.25 MG tablet, take 1 tablet by mouth every 8 hours if needed, Disp: 30 tablet, Rfl: 3 .  lisinopril-hydrochlorothiazide (PRINZIDE,ZESTORETIC) 20-12.5 MG tablet, take 1 tablet by mouth once daily, Disp: 90 tablet, Rfl: 4 .  omeprazole (PRILOSEC OTC) 20 MG tablet, Take 1 tablet by mouth daily., Disp: , Rfl:  .  propranolol ER (INDERAL LA) 120 MG 24 hr capsule, take 1 capsule by mouth once daily, Disp: 30 capsule, Rfl: 3 .  verapamil (VERELAN) 100 MG 24 hr capsule, take 1 capsule by mouth at bedtime (Patient not taking: Reported on 02/14/2017), Disp: 30 capsule, Rfl: 3   Patient Care Team: Ryan Limesonald E Naina Sleeper, MD as PCP - General (Family Medicine)      Objective:   Vitals: BP 130/80 (BP Location: Right Arm, Patient Position: Sitting, Cuff Size: Large)   Pulse (!) 55   Temp 98.1 F (36.7 C) (Oral)   Resp 16   Ht 6\' 1"  (1.854 m)   Wt 230 lb (104.3 kg)   SpO2 97%   BMI 30.34 kg/m    Vitals:   02/14/17 1416  BP: 130/80  Pulse: (!) 55  Resp: 16  Temp: 98.1 F (36.7 C)  TempSrc: Oral  SpO2: 97%  Weight: 230 lb (104.3 kg)  Height: 6\' 1"  (1.854 m)     Physical Exam   Depression Screen PHQ 2/9 Scores 02/14/2017 02/08/2016  PHQ - 2 Score 0 0  PHQ- 9 Score 3 3      Assessment & Plan:     Routine Health Maintenance and Physical Exam  Exercise Activities and Dietary recommendations Goals    None      Immunization History  Administered Date(s) Administered  . Influenza-Unspecified 10/05/2015  . Td 12/04/2001  . Tdap 07/20/2009    Health Maintenance  Topic Date Due  . HIV Screening  04/22/1974  . INFLUENZA VACCINE  07/04/2016  . TETANUS/TDAP  07/21/2019  . COLONOSCOPY  11/06/2019  . Hepatitis C Screening  Completed     Discussed health benefits of  physical activity, and encouraged him to engage in regular exercise appropriate for his age and condition.    --------------------------------------------------------------------  1. Annual physical exam  - Comprehensive metabolic panel - Lipid panel - EKG 12-Lead  2. Essential (primary) hypertension Well controlled.  Continue current medications.  Stay off of verapamil.   3. Benign essential tremor Stable on betablocker  4. Anxiety disorder, unspecified type Does with occasional alprazolam.   5. Cough Samples of Symbicort 80  6. Prostate cancer screening  - PSA  7. Hyperglycemia  - Hemoglobin A1c  8. Chewing tobacco use Counseled to stop using chewing tobacco.   He reports that he was given flu vaccine at work.  Lelon Huh, MD  [facility]

## 2017-02-22 LAB — COMPREHENSIVE METABOLIC PANEL
ALBUMIN: 4.2 g/dL (ref 3.5–5.5)
ALT: 24 IU/L (ref 0–44)
AST: 21 IU/L (ref 0–40)
Albumin/Globulin Ratio: 1.5 (ref 1.2–2.2)
Alkaline Phosphatase: 87 IU/L (ref 39–117)
BUN/Creatinine Ratio: 12 (ref 9–20)
BUN: 14 mg/dL (ref 6–24)
Bilirubin Total: 0.4 mg/dL (ref 0.0–1.2)
CALCIUM: 9 mg/dL (ref 8.7–10.2)
CO2: 22 mmol/L (ref 18–29)
CREATININE: 1.16 mg/dL (ref 0.76–1.27)
Chloride: 100 mmol/L (ref 96–106)
GFR, EST AFRICAN AMERICAN: 80 mL/min/{1.73_m2} (ref >59)
GFR, EST NON AFRICAN AMERICAN: 70 mL/min/{1.73_m2} (ref >59)
GLUCOSE: 107 mg/dL — AB (ref 65–99)
Globulin, Total: 2.8 g/dL (ref 1.5–4.5)
Potassium: 4.7 mmol/L (ref 3.5–5.2)
Sodium: 140 mmol/L (ref 134–144)
TOTAL PROTEIN: 7 g/dL (ref 6.0–8.5)

## 2017-02-22 LAB — LIPID PANEL
CHOLESTEROL TOTAL: 169 mg/dL (ref 100–199)
Chol/HDL Ratio: 5.6 ratio units — ABNORMAL HIGH (ref 0.0–5.0)
HDL: 30 mg/dL — ABNORMAL LOW (ref >39)
LDL CALC: 111 mg/dL — AB (ref 0–99)
Triglycerides: 142 mg/dL (ref 0–149)
VLDL CHOLESTEROL CAL: 28 mg/dL (ref 5–40)

## 2017-02-22 LAB — PSA: Prostate Specific Ag, Serum: 1.1 ng/mL (ref 0.0–4.0)

## 2017-02-22 LAB — HEMOGLOBIN A1C
Est. average glucose Bld gHb Est-mCnc: 117 mg/dL
Hgb A1c MFr Bld: 5.7 % — ABNORMAL HIGH (ref 4.8–5.6)

## 2017-02-27 ENCOUNTER — Other Ambulatory Visit: Payer: Self-pay | Admitting: Family Medicine

## 2017-02-27 DIAGNOSIS — G250 Essential tremor: Secondary | ICD-10-CM

## 2017-08-30 ENCOUNTER — Other Ambulatory Visit: Payer: Self-pay | Admitting: Family Medicine

## 2017-08-30 DIAGNOSIS — G250 Essential tremor: Secondary | ICD-10-CM

## 2017-09-18 ENCOUNTER — Other Ambulatory Visit: Payer: Self-pay | Admitting: Family Medicine

## 2017-10-10 ENCOUNTER — Other Ambulatory Visit: Payer: Self-pay | Admitting: Family Medicine

## 2017-10-10 NOTE — Telephone Encounter (Signed)
Rx called in to pharmacy. 

## 2017-10-10 NOTE — Telephone Encounter (Signed)
Please call in alprazolam.  

## 2018-03-13 ENCOUNTER — Encounter: Payer: Self-pay | Admitting: Family Medicine

## 2018-03-13 ENCOUNTER — Ambulatory Visit (INDEPENDENT_AMBULATORY_CARE_PROVIDER_SITE_OTHER): Payer: Managed Care, Other (non HMO) | Admitting: Family Medicine

## 2018-03-13 VITALS — BP 130/80 | HR 57 | Temp 98.4°F | Resp 16 | Ht 72.5 in | Wt 216.0 lb

## 2018-03-13 DIAGNOSIS — Z125 Encounter for screening for malignant neoplasm of prostate: Secondary | ICD-10-CM

## 2018-03-13 DIAGNOSIS — I10 Essential (primary) hypertension: Secondary | ICD-10-CM

## 2018-03-13 DIAGNOSIS — Z0000 Encounter for general adult medical examination without abnormal findings: Secondary | ICD-10-CM | POA: Diagnosis not present

## 2018-03-13 DIAGNOSIS — G250 Essential tremor: Secondary | ICD-10-CM

## 2018-03-13 DIAGNOSIS — Z720 Tobacco use: Secondary | ICD-10-CM | POA: Diagnosis not present

## 2018-03-13 MED ORDER — VARENICLINE TARTRATE 0.5 MG X 11 & 1 MG X 42 PO MISC: ORAL | 0 refills | Status: DC

## 2018-03-13 MED ORDER — PROPRANOLOL HCL ER 80 MG PO CP24: 80.0000 mg | ORAL_CAPSULE | Freq: Every day | ORAL | 3 refills | Status: DC

## 2018-03-13 NOTE — Progress Notes (Signed)
Patient: Ryan Mcmahon, Male    DOB: 12/06/1960, 57 y.o.   MRN: [id number] Visit Date: 03/13/2018  Today's Provider: Mila Merry, MD   Chief Complaint  Patient presents with  . Annual Exam  . Hypertension  . Tremors  . Hyperglycemia  . Anxiety   Subjective:    Annual physical exam Ryan Mcmahon is a 57 y.o. male who presents today for health maintenance and complete physical. He feels fairly well. He reports exercising 2-3 times a week. He reports he is sleeping well. He has been on weight watcher  Diet and is noted to have lost 15 pounds since last year.   -----------------------------------------------------------------   Hypertension, follow-up:  BP Readings from Last 3 Encounters:  02/14/17 130/80  06/19/16 118/80  05/18/16 132/80    He was last seen for hypertension 1 years ago.  BP at that visit was 130/80. No changes were made at that time He is not having side effects.  He is exercising. He is adherent to low salt diet.   Outside blood pressures are checked occasionally and average 133-138/80's. He is experiencing none.  Patient denies chest pain, chest pressure/discomfort, claudication, dyspnea, exertional chest pressure/discomfort, fatigue, irregular heart beat, lower extremity edema, near-syncope, orthopnea, palpitations, paroxysmal nocturnal dyspnea, syncope and tachypnea.   Cardiovascular risk factors include advanced age (older than 38 for men, 26 for women), hypertension and male gender.  Use of agents associated with hypertension: none.   Wt Readings from Last 3 Encounters:  03/13/18 216 lb (98 kg)  02/14/17 230 lb (104.3 kg)  06/19/16 227 lb (103 kg)    ------------------------------------------------------------------------  Benign essential tremor  States is improved since change from verapamil to propranolol, but heartrate is often in the 40s and feels fatigued.   Anxiety disorder, unspecified type On prn alprazolam, usually only  takes 1/2 a day which still works well.    Chewing tobacco use From 02/14/2017-counseled to stop using chewing tobacco. Today patient comes in reporting that he still chews tobacco. Patient states he is interested in quitting and would like to try Chantix.    Review of Systems  Constitutional: Negative for appetite change, chills, fatigue and fever.  HENT: Positive for rhinorrhea. Negative for congestion, ear pain, hearing loss, nosebleeds and trouble swallowing.   Eyes: Negative for pain and visual disturbance.  Respiratory: Negative for cough, chest tightness and shortness of breath.   Cardiovascular: Negative for chest pain, palpitations and leg swelling.  Gastrointestinal: Negative for abdominal pain, blood in stool, constipation, diarrhea, nausea and vomiting.  Endocrine: Negative for polydipsia, polyphagia and polyuria.  Genitourinary: Negative for dysuria and flank pain.  Musculoskeletal: Negative for arthralgias, back pain, joint swelling, myalgias and neck stiffness.  Skin: Negative for color change, rash and wound.  Neurological: Positive for tremors. Negative for dizziness, seizures, speech difficulty, weakness, light-headedness and headaches.  Psychiatric/Behavioral: Negative for behavioral problems, confusion, decreased concentration, dysphoric mood and sleep disturbance. The patient is not nervous/anxious.   All other systems reviewed and are negative.   Social History      He  reports that he has never smoked. His smokeless tobacco use includes chew. He reports that he drinks alcohol. He reports that he does not use drugs.       Social History   Socioeconomic History  . Marital status: Married    Spouse name: Not on file  . Number of children: 2  . Years of education: Not on file  . Highest  education level: Not on file  Occupational History  . Not on file  Social Needs  . Financial resource strain: Not on file  . Food insecurity:    Worry: Not on file     Inability: Not on file  . Transportation needs:    Medical: Not on file    Non-medical: Not on file  Tobacco Use  . Smoking status: Never Smoker  . Smokeless tobacco: Current User    Types: Chew  Substance and Sexual Activity  . Alcohol use: Yes    Alcohol/week: 0.0 oz    Comment: occasional use  . Drug use: No  . Sexual activity: Not on file  Lifestyle  . Physical activity:    Days per week: Not on file    Minutes per session: Not on file  . Stress: Not on file  Relationships  . Social connections:    Talks on phone: Not on file    Gets together: Not on file    Attends religious service: Not on file    Active member of club or organization: Not on file    Attends meetings of clubs or organizations: Not on file    Relationship status: Not on file  Other Topics Concern  . Not on file  Social History Narrative  . Not on file    No past medical history on file.   Patient Active Problem List   Diagnosis Date Noted  . Chewing tobacco use 02/14/2017  . Actinic keratosis 02/08/2016  . 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 Hypogonadism male 02/03/2016  . Left hand paresthesia 02/03/2016  . Neck pain 02/03/2016  . Obesity 02/03/2016  . Benign essential tremor 02/03/2016  . Anxiety disorder 01/22/2008  . Essential (primary) hypertension 05/05/2004  . GERD (gastroesophageal reflux disease) 05/05/2002  . Benign prostatic hypertrophy without urinary obstruction 12/04/2001    Past Surgical History:  Procedure Laterality Date  . CHOLECYSTECTOMY  01/26/2015   [facility]; Dr. Brigid Re  . GALLBLADDER SURGERY  3/16  . TONSILLECTOMY AND ADENOIDECTOMY  1960's    Family History        Family Status  Relation Name Status  . Mother  Alive  . Father  Deceased at age 60's       Died from Huntingtons Disease  . Sister  Alive  . Other grandfather Deceased        His family history includes Heart attack in his other; Huntington's disease in his father.       Allergies  Allergen Reactions  . Effexor Xr  [Venlafaxine]   . Tarka  [Trandolapril-Verapamil Hcl Er]     Other reaction(s): Constipation, Nausea     Current Outpatient Medications:  .  ALPRAZolam (XANAX) 0.25 MG tablet, take 1 tablet by mouth every 8 hours if needed, Disp: 30 tablet, Rfl: 3 .  lisinopril-hydrochlorothiazide (PRINZIDE,ZESTORETIC) 20-12.5 MG tablet, take 1 tablet by mouth once daily, Disp: 90 tablet, Rfl: 4 .  omeprazole (PRILOSEC OTC) 20 MG tablet, Take 1 tablet by mouth daily., Disp: , Rfl:  .  propranolol ER (INDERAL LA) 120 MG 24 hr capsule, take 1 capsule by mouth once daily, Disp: 30 capsule, Rfl: 11 .  budesonide-formoterol (SYMBICORT) 160-4.5 MCG/ACT inhaler, Inhale 2 puffs into the lungs 2 (two) times daily. (Patient not taking: Reported on 03/13/2018), Disp: 1 Inhaler, Rfl: 12   Patient Care Team: Malva Limes, MD as PCP - General (Family Medicine)      Objective:   Vitals: BP  130/80 (BP Location: Left Arm, Patient Position: Sitting, Cuff Size: Large)   Pulse (!) 57   Temp 98.4 F (36.9 C) (Oral)   Resp 16   Ht 6' 0.5" (1.842 m)   Wt 216 lb (98 kg)   SpO2 99% Comment: room air  BMI 28.89 kg/m      Physical Exam   General Appearance:    Alert, cooperative, no distress, appears stated age  Head:    Normocephalic, without obvious abnormality, atraumatic  Eyes:    PERRL, conjunctiva/corneas clear, EOM's intact, fundi    benign, both eyes       Ears:    Normal TM's and external ear canals, both ears  Nose:   Nares normal, septum midline, mucosa normal, no drainage   or sinus tenderness  Throat:   Lips, mucosa, and tongue normal; teeth and gums normal  Neck:   Supple, symmetrical, trachea midline, no adenopathy;       thyroid:  No enlargement/tenderness/nodules; no carotid   bruit or JVD  Back:     Symmetric, no curvature, ROM normal, no CVA tenderness  Lungs:     Clear to auscultation bilaterally, respirations unlabored  Chest wall:    No  tenderness or deformity  Heart:    Regular rate and rhythm, S1 and S2 normal, no murmur, rub   or gallop  Abdomen:     Soft, non-tender, bowel sounds active all four quadrants,    no masses, no organomegaly  Genitalia:    deferred  Rectal:    deferred  Extremities:   Extremities normal, atraumatic, no cyanosis or edema  Pulses:   2+ and symmetric all extremities  Skin:   Skin color, texture, turgor normal, no rashes or lesions  Lymph nodes:   Cervical, supraclavicular, and axillary nodes normal  Neurologic:   CNII-XII intact. Normal strength, sensation and reflexes      throughout    Depression Screen PHQ 2/9 Scores 03/13/2018 02/14/2017 02/08/2016  PHQ - 2 Score 0 0 0  PHQ- 9 Score Assessment & Plan:     Routine Health Maintenance and Physical Exam  Exercise Activities and Dietary recommendations Goals    None      Immunization History  Administered Date(s) Administered  . Influenza-Unspecified 10/05/2015, 09/12/2017  . Td 12/04/2001  . Tdap 07/20/2009    Health Maintenance  Topic Date Due  . HIV Screening  04/22/1974  . INFLUENZA VACCINE  07/04/2018  . TETANUS/TDAP  07/21/2019  . COLONOSCOPY  11/06/2019  . Hepatitis C Screening  Completed     Discussed health benefits of physical activity, and encouraged him to engage in regular exercise appropriate for his age and condition.    --------------------------------------------------------------------  1. Annual physical exam  - Comprehensive metabolic panel - Lipid panel - Hemoglobin A1c - EKG 12-Lead  2. Essential (primary) hypertension Well controlled.  Continue current medications.   - Comprehensive metabolic panel - Lipid panel - Hemoglobin A1c - EKG 12-Lead  3. Benign essential tremor A little bradycardic on current dose of propranolol, will decrease to - propranolol ER (INDERAL LA) 80 MG 24 hr capsule; Take 1 capsule (80 mg total) by mouth daily.  Dispense: 90 capsule; Refill: 3  4.  Chewing tobacco use Start Chantix, if effective will call back for maintenance pack.   5. Prostate cancer screening  - PSA    Mila Merry, MD  [facility]

## 2018-03-13 NOTE — Patient Instructions (Addendum)
Call for Chantix refill in about 3 weeks if it is working.    Preventive Care 40-64 Years, Male Preventive care refers to lifestyle choices and visits with your health care provider that can promote health and wellness. What does preventive care include?  A yearly physical exam. This is also called an annual well check.  Dental exams once or twice a year.  Routine eye exams. Ask your health care provider how often you should have your eyes checked.  Personal lifestyle choices, including: ? Daily care of your teeth and gums. ? Regular physical activity. ? Eating a healthy diet. ? Avoiding tobacco and drug use. ? Limiting alcohol use. ? Practicing safe sex. ? Taking low-dose aspirin every day starting at age 50. What happens during an annual well check? The services and screenings done by your health care provider during your annual well check will depend on your age, overall health, lifestyle risk factors, and family history of disease. Counseling Your health care provider may ask you questions about your:  Alcohol use.  Tobacco use.  Drug use.  Emotional well-being.  Home and relationship well-being.  Sexual activity.  Eating habits.  Work and work Statistician.  Screening You may have the following tests or measurements:  Height, weight, and BMI.  Blood pressure.  Lipid and cholesterol levels. These may be checked every 5 years, or more frequently if you are over 51 years old.  Skin check.  Lung cancer screening. You may have this screening every year starting at age 51 if you have a 30-pack-year history of smoking and currently smoke or have quit within the past 15 years.  Fecal occult blood test (FOBT) of the stool. You may have this test every year starting at age 80.  Flexible sigmoidoscopy or colonoscopy. You may have a sigmoidoscopy every 5 years or a colonoscopy every 10 years starting at age 59.  Prostate cancer screening. Recommendations will vary  depending on your family history and other risks.  Hepatitis C blood test.  Hepatitis B blood test.  Sexually transmitted disease (STD) testing.  Diabetes screening. This is done by checking your blood sugar (glucose) after you have not eaten for a while (fasting). You may have this done every 1-3 years.  Discuss your test results, treatment options, and if necessary, the need for more tests with your health care provider. Vaccines Your health care provider may recommend certain vaccines, such as:  Influenza vaccine. This is recommended every year.  Tetanus, diphtheria, and acellular pertussis (Tdap, Td) vaccine. You may need a Td booster every 10 years.  Varicella vaccine. You may need this if you have not been vaccinated.  Zoster vaccine. You may need this after age 72.  Measles, mumps, and rubella (MMR) vaccine. You may need at least one dose of MMR if you were born in 1957 or later. You may also need a second dose.  Pneumococcal 13-valent conjugate (PCV13) vaccine. You may need this if you have certain conditions and have not been vaccinated.  Pneumococcal polysaccharide (PPSV23) vaccine. You may need one or two doses if you smoke cigarettes or if you have certain conditions.  Meningococcal vaccine. You may need this if you have certain conditions.  Hepatitis A vaccine. You may need this if you have certain conditions or if you travel or work in places where you may be exposed to hepatitis A.  Hepatitis B vaccine. You may need this if you have certain conditions or if you travel or work in  places where you may be exposed to hepatitis B.  Haemophilus influenzae type b (Hib) vaccine. You may need this if you have certain risk factors.  Talk to your health care provider about which screenings and vaccines you need and how often you need them. This information is not intended to replace advice given to you by your health care provider. Make sure you discuss any questions you have  with your health care provider. Document Released: 12/17/2015 Document Revised: 08/09/2016 Document Reviewed: 09/21/2015 Elsevier Interactive Patient Education  [name].

## 2018-03-15 LAB — COMPREHENSIVE METABOLIC PANEL
A/G RATIO: 1.7 (ref 1.2–2.2)
ALBUMIN: 4 g/dL (ref 3.5–5.5)
ALT: 18 IU/L (ref 0–44)
AST: 17 IU/L (ref 0–40)
Alkaline Phosphatase: 86 IU/L (ref 39–117)
BILIRUBIN TOTAL: 0.4 mg/dL (ref 0.0–1.2)
BUN / CREAT RATIO: 13 (ref 9–20)
BUN: 13 mg/dL (ref 6–24)
CHLORIDE: 102 mmol/L (ref 96–106)
CO2: 23 mmol/L (ref 20–29)
Calcium: 8.6 mg/dL — ABNORMAL LOW (ref 8.7–10.2)
Creatinine, Ser: 1.02 mg/dL (ref 0.76–1.27)
GFR calc non Af Amer: 81 mL/min/{1.73_m2} (ref >59)
GFR, EST AFRICAN AMERICAN: 93 mL/min/{1.73_m2} (ref >59)
Globulin, Total: 2.3 g/dL (ref 1.5–4.5)
Glucose: 97 mg/dL (ref 65–99)
POTASSIUM: 4 mmol/L (ref 3.5–5.2)
Sodium: 140 mmol/L (ref 134–144)
TOTAL PROTEIN: 6.3 g/dL (ref 6.0–8.5)

## 2018-03-15 LAB — LIPID PANEL
CHOL/HDL RATIO: 5 ratio (ref 0.0–5.0)
Cholesterol, Total: 166 mg/dL (ref 100–199)
HDL: 33 mg/dL — ABNORMAL LOW (ref >39)
LDL Calculated: 95 mg/dL (ref 0–99)
Triglycerides: 188 mg/dL — ABNORMAL HIGH (ref 0–149)
VLDL Cholesterol Cal: 38 mg/dL (ref 5–40)

## 2018-03-15 LAB — PSA: Prostate Specific Ag, Serum: 1.2 ng/mL (ref 0.0–4.0)

## 2018-03-15 LAB — HEMOGLOBIN A1C
Est. average glucose Bld gHb Est-mCnc: 114 mg/dL
Hgb A1c MFr Bld: 5.6 % (ref 4.8–5.6)

## 2018-03-18 ENCOUNTER — Ambulatory Visit (INDEPENDENT_AMBULATORY_CARE_PROVIDER_SITE_OTHER): Payer: Managed Care, Other (non HMO) | Admitting: Podiatry

## 2018-03-18 ENCOUNTER — Encounter: Payer: Self-pay | Admitting: Podiatry

## 2018-03-18 ENCOUNTER — Ambulatory Visit (INDEPENDENT_AMBULATORY_CARE_PROVIDER_SITE_OTHER): Payer: Managed Care, Other (non HMO)

## 2018-03-18 VITALS — BP 158/91 | HR 60 | Resp 16

## 2018-03-18 DIAGNOSIS — M1990 Unspecified osteoarthritis, unspecified site: Secondary | ICD-10-CM

## 2018-03-18 DIAGNOSIS — M778 Other enthesopathies, not elsewhere classified: Secondary | ICD-10-CM

## 2018-03-18 DIAGNOSIS — M779 Enthesopathy, unspecified: Secondary | ICD-10-CM

## 2018-03-18 NOTE — Progress Notes (Signed)
Subjective:  Patient ID: Ryan Mcmahon, male    DOB: [birth year],  MRN: [id number] HPI Chief Complaint  Patient presents with  . Foot Pain    Dorsal midfoot and anterior ankle left - aching x 2 years, usually worse with lengthy activity, no injury, cramps at night, active golfer, tried Ibuprofen - ortho eval - xrayed and gave ankle brace-temp relief  . New Patient (Initial Visit)    57 y.o. male presents with the above complaint.   Review of systems: Denies fever chills nausea vomiting muscle aches pains calf pain back pain chest pain shortness of breath and headache.  No past medical history on file. Past Surgical History:  Procedure Laterality Date  . CHOLECYSTECTOMY  01/26/2015   [facility]; Dr. Brigid ReLunquist  . GALLBLADDER SURGERY  3/16  . TONSILLECTOMY AND ADENOIDECTOMY  1960's    Current Outpatient Medications:  .  ALPRAZolam (XANAX) 0.25 MG tablet, take 1 tablet by mouth every 8 hours if needed, Disp: 30 tablet, Rfl: 3 .  amoxicillin-clavulanate (AUGMENTIN) 875-125 MG tablet, amoxicillin 875 mg-potassium clavulanate 125 mg tablet  take 1 tablet by mouth every 12 hours for 7 days, Disp: , Rfl:  .  fluticasone (FLONASE) 50 MCG/ACT nasal spray, fluticasone propionate 50 mcg/actuation nasal spray,suspension  instill 2 sprays into each nostril once daily, Disp: , Rfl:  .  lisinopril-hydrochlorothiazide (PRINZIDE,ZESTORETIC) 20-12.5 MG tablet, take 1 tablet by mouth once daily, Disp: 90 tablet, Rfl: 4 .  meloxicam (MOBIC) 15 MG tablet, meloxicam 15 mg tablet  take 1 tablet by mouth once daily with food, Disp: , Rfl:  .  omeprazole (PRILOSEC OTC) 20 MG tablet, Take 1 tablet by mouth daily., Disp: , Rfl:  .  propranolol ER (INDERAL LA) 80 MG 24 hr capsule, Take 1 capsule (80 mg total) by mouth daily., Disp: 90 capsule, Rfl: 3  Allergies  Allergen Reactions  . Effexor Xr  [Venlafaxine]   . Tarka  [Trandolapril-Verapamil Hcl Er]     Other reaction(s): Constipation, Nausea   Review of  Systems Objective:   Vitals:   03/18/18 0932  BP: (!) 158/91  Pulse: 60  Resp: 16    General: Well developed, nourished, in no acute distress, alert and oriented x3   Dermatological: Skin is warm, dry and supple bilateral. Nails x 10 are well maintained; remaining integument appears unremarkable at this time. There are no open sores, no preulcerative lesions, no rash or signs of infection present.  Vascular: Dorsalis Pedis artery and Posterior Tibial artery pedal pulses are 2/4 bilateral with immedate capillary fill time. Pedal hair growth present. No varicosities and no lower extremity edema present bilateral.   Neruologic: Grossly intact via light touch bilateral. Vibratory intact via tuning fork bilateral. Protective threshold with Semmes Wienstein monofilament intact to all pedal sites bilateral. Patellar and Achilles deep tendon reflexes 2+ bilateral. No Babinski or clonus noted bilateral.   Musculoskeletal: No gross boney pedal deformities bilateral. No pain, crepitus, or limitation noted with foot and ankle range of motion bilateral. Muscular strength 5/5 in all groups tested bilateral.  Pain on palpation to the dorsal lateral aspect of the left foot as well as frontal plane range of motion particular around the second and third tarsometatarsal joint.  Not a lot of of osseous eburnation dorsal aspect of the foot.  Gait: Unassisted, Nonantalgic.    Radiographs:  Radiographs of the left foot taken today demonstrates no major osseous abnormalities other than some joint space narrowing of the tarsometatarsal joint as well as  the second tarsometatarsal joint of the left foot.  Assessment & Plan:   Assessment: Osteoarthritis midfoot left.  Capsulitis subtalar joint left.    Plan: Discussed etiology pathology conservative versus surgical therapies.  At this point I feel that a set of orthotics will benefit him greater than anything else we may need to give him an injection between the  orthotics command to help reduce the inflammation but otherwise orthotics will be his primary treatment plan.  He would like to consider an orthotic that is three-quarter length her metatarsal length as opposed to full length.  He would also like to be able to wear them in dress shoes so an intrinsic posterior post to be nice he will follow-up with [name] in the near future.     [name], [place]

## 2018-04-03 ENCOUNTER — Ambulatory Visit (INDEPENDENT_AMBULATORY_CARE_PROVIDER_SITE_OTHER): Payer: Managed Care, Other (non HMO) | Admitting: Orthotics

## 2018-04-03 DIAGNOSIS — M779 Enthesopathy, unspecified: Principal | ICD-10-CM

## 2018-04-03 DIAGNOSIS — M7750 Other enthesopathy of unspecified foot: Secondary | ICD-10-CM | POA: Diagnosis not present

## 2018-04-03 DIAGNOSIS — M778 Other enthesopathies, not elsewhere classified: Secondary | ICD-10-CM

## 2018-04-03 NOTE — Progress Notes (Signed)
Saw patient today for CMFO to address midfoot pain dorsum and anterior ankle.  Patient presents with ankle inversion (supinates); plan on f/o that hugs the arche and offers pronatory torque. (4* RF valgus)

## 2018-04-10 ENCOUNTER — Ambulatory Visit: Payer: Managed Care, Other (non HMO) | Admitting: Family Medicine

## 2018-04-10 ENCOUNTER — Encounter: Payer: Self-pay | Admitting: Family Medicine

## 2018-04-10 VITALS — BP 120/84 | HR 48 | Temp 98.2°F | Resp 16 | Wt 218.0 lb

## 2018-04-10 DIAGNOSIS — R05 Cough: Secondary | ICD-10-CM | POA: Diagnosis not present

## 2018-04-10 DIAGNOSIS — J069 Acute upper respiratory infection, unspecified: Secondary | ICD-10-CM | POA: Diagnosis not present

## 2018-04-10 DIAGNOSIS — R059 Cough, unspecified: Secondary | ICD-10-CM

## 2018-04-10 MED ORDER — BUDESONIDE-FORMOTEROL FUMARATE 160-4.5 MCG/ACT IN AERO: 2.0000 | INHALATION_SPRAY | Freq: Two times a day (BID) | RESPIRATORY_TRACT | 0 refills | Status: DC

## 2018-04-10 NOTE — Progress Notes (Signed)
Patient: Ryan Mcmahon Male    DOB: 1961/10/09   57 y.o.   MRN: [id number] Visit Date: 04/10/2018  Today's Provider: Mila Merry, MD   Chief Complaint  Patient presents with  . Cough   Subjective:    Cough  This is a new problem. The current episode started in the past 7 days (4 days). The problem has been unchanged. The problem occurs every few minutes. The cough is productive of sputum. Associated symptoms include ear congestion, headaches, nasal congestion, rhinorrhea, a sore throat and wheezing. Pertinent negatives include no chest pain, chills, ear pain, fever, heartburn, hemoptysis, myalgias, postnasal drip, rash, shortness of breath, sweats or weight loss. The symptoms are aggravated by lying down. Treatments tried: Delsym, Ibuprofen, Vicks cold and flu,  The treatment provided mild relief. There is no history of asthma, bronchiectasis, bronchitis, COPD, emphysema, environmental allergies or pneumonia.    Patient has had cough and nasal congestion for 4 days. Patient states cough is really bad at night. Other symptoms include: ear congestion, sore throat, headaches, runny nose, and slight wheezing. Patient has been taking otc Delsym, ibuprofen, Vicks cold and flu with mild relief. He states that he has done very well with Symbicort in the past, but does not tolerate prescription cough syrups very well.   Allergies  Allergen Reactions  . Effexor Xr  [Venlafaxine]   . Tarka  [Trandolapril-Verapamil Hcl Er]     Other reaction(s): Constipation, Nausea     Current Outpatient Medications:  .  ALPRAZolam (XANAX) 0.25 MG tablet, take 1 tablet by mouth every 8 hours if needed, Disp: 30 tablet, Rfl: 3 .  lisinopril-hydrochlorothiazide (PRINZIDE,ZESTORETIC) 20-12.5 MG tablet, take 1 tablet by mouth once daily, Disp: 90 tablet, Rfl: 4 .  omeprazole (PRILOSEC OTC) 20 MG tablet, Take 1 tablet by mouth daily., Disp: , Rfl:  .  propranolol ER (INDERAL LA) 80 MG 24 hr capsule, Take 1  capsule (80 mg total) by mouth daily., Disp: 90 capsule, Rfl: 3 .  fluticasone (FLONASE) 50 MCG/ACT nasal spray, fluticasone propionate 50 mcg/actuation nasal spray,suspension  instill 2 sprays into each nostril once daily, Disp: , Rfl:   Review of Systems  Constitutional: Negative for appetite change, chills, fever and weight loss.  HENT: Positive for congestion, rhinorrhea, sneezing, sore throat and voice change. Negative for ear pain, postnasal drip, sinus pressure and sinus pain.   Respiratory: Positive for cough and wheezing. Negative for hemoptysis, chest tightness and shortness of breath.   Cardiovascular: Negative for chest pain and palpitations.  Gastrointestinal: Negative for abdominal pain, heartburn, nausea and vomiting.  Musculoskeletal: Negative for myalgias.  Skin: Negative for rash.  Allergic/Immunologic: Negative for environmental allergies.  Neurological: Positive for headaches.    Social History   Tobacco Use  . Smoking status: Never Smoker  . Smokeless tobacco: Current User    Types: Chew  Substance Use Topics  . Alcohol use: Yes    Alcohol/week: 0.0 oz    Comment: occasional use   Objective:   BP 120/84 (BP Location: Right Arm, Patient Position: Sitting, Cuff Size: Large)   Pulse (!) 48   Temp 98.2 F (36.8 C) (Oral)   Resp 16   Wt 218 lb (98.9 kg)   SpO2 98%   BMI 29.16 kg/m  Vitals:   04/10/18 1053  BP: 120/84  Pulse: (!) 48  Resp: 16  Temp: 98.2 F (36.8 C)  TempSrc: Oral  SpO2: 98%  Weight: 218 lb (98.9 kg)  Physical Exam  General Appearance:    Alert, cooperative, no distress  HENT:   neck without nodes, sinuses nontender, post nasal drip noted and nasal mucosa pale and congested  Eyes:    PERRL, conjunctiva/corneas clear, EOM's intact       Lungs:     Occasional expiratory wheeze, no rales or rhonchi,  respirations unlabored  Heart:    Regular rate and rhythm  Neurologic:   Awake, alert, oriented x 3. No apparent focal neurological            defect.          Assessment & Plan:     1. Cough   2. Upper respiratory tract infection, unspecified type  - budesonide-formoterol (SYMBICORT) 160-4.5 MCG/ACT inhaler; Inhale 2 puffs into the lungs 2 (two) times daily.  Dispense: 1 Inhaler; Refill: 0  Counseled regarding signs and symptoms of viral and bacterial respiratory infections. Advised to call or return for additional evaluation if he develops any sign of bacterial infection, or if current symptoms last longer than 10 days.        Mila Merry, MD  [facility]

## 2018-04-10 NOTE — Patient Instructions (Signed)
   Call if you develop persistent pain in your sinuses, fever over 101 F, or wheezing in you chest, or if you are not improving 10 days after start of symptoms.

## 2018-04-24 ENCOUNTER — Ambulatory Visit: Payer: Managed Care, Other (non HMO) | Admitting: Orthotics

## 2018-04-24 DIAGNOSIS — M1990 Unspecified osteoarthritis, unspecified site: Secondary | ICD-10-CM

## 2018-04-24 DIAGNOSIS — M779 Enthesopathy, unspecified: Principal | ICD-10-CM

## 2018-04-24 DIAGNOSIS — M778 Other enthesopathies, not elsewhere classified: Secondary | ICD-10-CM

## 2018-04-24 NOTE — Progress Notes (Signed)
Patient came in today to pick up custom made foot orthotics.  The goals were accomplished and the patient reported no dissatisfaction with said orthotics.  Patient was advised of breakin period and how to report any issues. 

## 2018-06-17 ENCOUNTER — Other Ambulatory Visit: Payer: Self-pay | Admitting: Family Medicine

## 2018-10-10 ENCOUNTER — Other Ambulatory Visit: Payer: Self-pay | Admitting: Family Medicine

## 2019-02-09 ENCOUNTER — Other Ambulatory Visit: Payer: Self-pay | Admitting: Family Medicine

## 2019-03-11 ENCOUNTER — Other Ambulatory Visit: Payer: Self-pay | Admitting: Family Medicine

## 2019-03-11 DIAGNOSIS — G250 Essential tremor: Secondary | ICD-10-CM

## 2019-05-06 ENCOUNTER — Ambulatory Visit: Payer: Managed Care, Other (non HMO) | Admitting: Family Medicine

## 2019-05-06 ENCOUNTER — Ambulatory Visit
Admission: RE | Admit: 2019-05-06 | Discharge: 2019-05-06 | Disposition: A | Discharge status: Home / Self Care | Payer: PRIVATE HEALTH INSURANCE | Source: Ambulatory Visit | Attending: Family Medicine | Admitting: Family Medicine

## 2019-05-06 ENCOUNTER — Other Ambulatory Visit: Payer: Self-pay

## 2019-05-06 ENCOUNTER — Encounter: Payer: Self-pay | Admitting: Family Medicine

## 2019-05-06 VITALS — BP 130/80 | HR 50 | Temp 98.0°F | Wt 224.0 lb

## 2019-05-06 DIAGNOSIS — M79671 Pain in right foot: Secondary | ICD-10-CM | POA: Insufficient documentation

## 2019-05-06 MED ORDER — MELOXICAM 15 MG PO TABS: 15.0000 mg | ORAL_TABLET | Freq: Every day | ORAL | 0 refills | Status: DC

## 2019-05-06 NOTE — Addendum Note (Signed)
Addended by: Dortha Kern E on: 05/06/2019 11:45 AM   Modules accepted: Orders

## 2019-05-06 NOTE — Progress Notes (Addendum)
Ryan Mcmahon  MRN: [id number] DOB: 06/04/1961  Subjective:  HPI   The patient is a 58 year old male who presents for evaluation of right heel pain. He states that it started 4 days ago.  Worse with walking and very little relief from Ibuprofen.  He has had no known trauma.  Past Surgical History:  Procedure Laterality Date  . CHOLECYSTECTOMY  01/26/2015   [facility]; Dr. Brigid Re  . GALLBLADDER SURGERY  3/16  . TONSILLECTOMY AND ADENOIDECTOMY  1960's   Family History  Problem Relation Age of Onset  . Huntington's disease Father   . Heart attack Other    Patient Active Problem List   Diagnosis Date Noted  . Chewing tobacco use 02/14/2017  . Actinic keratosis 02/08/2016  . 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 Hypogonadism male 02/03/2016  . Left hand paresthesia 02/03/2016  . Neck pain 02/03/2016  . Obesity 02/03/2016  . Benign essential tremor 02/03/2016  . Anxiety disorder 01/22/2008  . Essential (primary) hypertension 05/05/2004  . GERD (gastroesophageal reflux disease) 05/05/2002  . Benign prostatic hypertrophy without urinary obstruction 12/04/2001   No past medical history on file.  Social History   Socioeconomic History  . Marital status: Married    Spouse name: Not on file  . Number of children: 2  . Years of education: Not on file  . Highest education level: Not on file  Occupational History  . Not on file  Social Needs  . Financial resource strain: Not on file  . Food insecurity:    Worry: Not on file    Inability: Not on file  . Transportation needs:    Medical: Not on file    Non-medical: Not on file  Tobacco Use  . Smoking status: Never Smoker  . Smokeless tobacco: Current User    Types: Chew  Substance and Sexual Activity  . Alcohol use: Yes    Alcohol/week: 0.0 standard drinks    Comment: occasional use  . Drug use: No  . Sexual activity: Not on file  Lifestyle  . Physical activity:    Days per week: Not on file   Minutes per session: Not on file  . Stress: Not on file  Relationships  . Social connections:    Talks on phone: Not on file    Gets together: Not on file    Attends religious service: Not on file    Active member of club or organization: Not on file    Attends meetings of clubs or organizations: Not on file    Relationship status: Not on file  . Intimate partner violence:    Fear of current or ex partner: Not on file    Emotionally abused: Not on file    Physically abused: Not on file    Forced sexual activity: Not on file  Other Topics Concern  . Not on file  Social History Narrative  . Not on file    Outpatient Encounter Medications as of 05/06/2019  Medication Sig Note  . ALPRAZolam (XANAX) 0.25 MG tablet TAKE 1 TABLET BY MOUTH EVERY 8 HOURS AS NEEDED   . lisinopril-hydrochlorothiazide (PRINZIDE,ZESTORETIC) 20-12.5 MG tablet TAKE 1 TABLET BY MOUTH ONCE DAILY   . omeprazole (PRILOSEC OTC) 20 MG tablet Take 1 tablet by mouth daily. 02/03/2016: DX: 530.81 Received from: [employer] Received Sig:   . propranolol ER (INDERAL LA) 80 MG 24 hr capsule TAKE 1 CAPSULE(80 MG) BY MOUTH DAILY   . budesonide-formoterol (SYMBICORT)  160-4.5 MCG/ACT inhaler Inhale 2 puffs into the lungs 2 (two) times daily.    No facility-administered encounter medications on file as of 05/06/2019.    Allergies  Allergen Reactions  . Effexor Xr  [Venlafaxine]   . Tarka  [Trandolapril-Verapamil Hcl Er]     Other reaction(s): Constipation, Nausea    Review of Systems  Musculoskeletal: Positive for joint pain.    Objective:  BP 130/80 (BP Location: Right Arm, Patient Position: Sitting, Cuff Size: Normal)   Pulse (!) 50   Temp 98 F (36.7 C) (Oral)   Wt 224 lb (101.6 kg)   SpO2 99%   BMI 29.96 kg/m   Physical Exam  Constitutional: He is oriented to person, place, and time and well-developed, well-nourished, and in no distress.  HENT:  Head: Normocephalic.  Eyes: Conjunctivae are  normal.  Neck: Neck supple.  Pulmonary/Chest: Effort normal.  Abdominal: Soft.  Musculoskeletal: Normal range of motion.        General: Tenderness present.     Comments: Tender posterior right heel to palpate or dorsiflex while standing on it. Good pulses and no swelling around the heel or Achille's tendon.   Neurological: He is alert and oriented to person, place, and time.  Skin: No rash noted.  Psychiatric: Mood, affect and judgment normal.    Assessment and Plan :  1. Pain of right heel Developed pain in the back of the right heel on 05-04-19 while walking. Remembers he pushed a mower and hit some golf balls the previous day. Tried Ibuprofen with mild relief along with ice applications. Suspect Achille's strain and will get x-ray to rule out heel spur from chronic strain. Treat with Meloxicam and an ankle brace. May need referral to a podiatrist (has seen Dr. Al CorpusHyatt in 2019 for osteoarthritis in the right foot) if no better in 7-10 days. - meloxicam (MOBIC) 15 MG tablet; Take 1 tablet (15 mg total) by mouth daily.  Dispense: 30 tablet; Refill: 0 - [facility] Complete Right; Future

## 2019-05-09 ENCOUNTER — Telehealth: Payer: Self-pay

## 2019-05-09 NOTE — Telephone Encounter (Signed)
LMTCB

## 2019-05-09 NOTE — Telephone Encounter (Signed)
-----   Message from Tamsen Roers, [place] sent at 05/09/2019  8:47 AM EDT ----- X-ray confirms posterior heel spur in the Achille's tendon attachment area. If no better with use of Meloxicam for 10-14 days, will need referral to a podiatrist.

## 2019-05-14 NOTE — Telephone Encounter (Signed)
lmtcb

## 2019-05-19 NOTE — Telephone Encounter (Signed)
Advised 

## 2019-07-08 ENCOUNTER — Encounter: Payer: Self-pay | Admitting: Family Medicine

## 2019-07-08 ENCOUNTER — Other Ambulatory Visit: Payer: Self-pay

## 2019-07-08 ENCOUNTER — Ambulatory Visit (INDEPENDENT_AMBULATORY_CARE_PROVIDER_SITE_OTHER): Payer: PRIVATE HEALTH INSURANCE | Admitting: Family Medicine

## 2019-07-08 VITALS — BP 138/81 | HR 57 | Temp 98.0°F | Ht 73.5 in | Wt 223.0 lb

## 2019-07-08 DIAGNOSIS — G250 Essential tremor: Secondary | ICD-10-CM | POA: Diagnosis not present

## 2019-07-08 DIAGNOSIS — Z0000 Encounter for general adult medical examination without abnormal findings: Secondary | ICD-10-CM | POA: Diagnosis not present

## 2019-07-08 DIAGNOSIS — Z125 Encounter for screening for malignant neoplasm of prostate: Secondary | ICD-10-CM

## 2019-07-08 DIAGNOSIS — Z1211 Encounter for screening for malignant neoplasm of colon: Secondary | ICD-10-CM | POA: Diagnosis not present

## 2019-07-08 DIAGNOSIS — Z23 Encounter for immunization: Secondary | ICD-10-CM | POA: Diagnosis not present

## 2019-07-08 NOTE — Patient Instructions (Addendum)
.   Please review the attached list of medications and notify my office if there are any errors.   . Please bring all of your medications to every appointment so we can make sure that our medication list is the same as yours.   . We will have flu vaccines available after Labor Day. Please go to your pharmacy or call the office in early September to schedule you flu shot.   The CDC recommends two doses of Shingrix (the shingles vaccine) separated by 2 to 6 months for adults age 29 years and older. I recommend checking with your insurance plan regarding coverage for this vaccine.    A lot of people find that B vitamins help with energy. Try OTC B Complex or B12 vitamin every day. It usually takes 3-4 to help.    We could try a lower dose of propranolol to see if a lower dose would help with your energy level, but you would need to take it twice a day

## 2019-07-08 NOTE — Progress Notes (Signed)
Patient: Ryan Mcmahon, Male    DOB: 03-01-1961, 58 y.o.   MRN: [id number] Visit Date: 07/08/2019  Today's Provider: Mila Merryonald Trysten Berti, MD   Chief Complaint  Patient presents with  . Annual Exam   Subjective:     Annual physical exam Ryan Mcmahon is a 58 y.o. male who presents today for health maintenance and complete physical. He feels well. He has noticed his pulse has been low since changing verapamil to propranolol. He does feel tremors are better on verapamil.  He reports exercising some. He reports he is sleeping well. Feels a little more fatigued over the last several months. Is sleeping well. No other complaints.  -----------------------------------------------------------------   Review of Systems  Constitutional: Negative.   HENT: Negative.   Eyes: Negative.   Respiratory: Negative.   Cardiovascular: Negative.   Gastrointestinal: Negative.   Musculoskeletal: Negative.   Skin: Negative.   Allergic/Immunologic: Negative.   Neurological: Positive for tremors. Negative for dizziness, seizures, syncope, facial asymmetry, speech difficulty, weakness, light-headedness, numbness and headaches.  Hematological: Negative.   Psychiatric/Behavioral: Negative for agitation, behavioral problems, confusion, decreased concentration, dysphoric mood, hallucinations, self-injury, sleep disturbance and suicidal ideas. The patient is nervous/anxious. The patient is not hyperactive.     Social History      He  reports that he has never smoked. His smokeless tobacco use includes chew. He reports current alcohol use. He reports that he does not use drugs.       Social History   Socioeconomic History  . Marital status: Married    Spouse name: Not on file  . Number of children: 2  . Years of education: Not on file  . Highest education level: Not on file  Occupational History  . Not on file  Social Needs  . Financial resource strain: Not on file  . Food insecurity    Worry: Not on  file    Inability: Not on file  . Transportation needs    Medical: Not on file    Non-medical: Not on file  Tobacco Use  . Smoking status: Never Smoker  . Smokeless tobacco: Current User    Types: Chew  Substance and Sexual Activity  . Alcohol use: Yes    Alcohol/week: 0.0 standard drinks    Comment: occasional use  . Drug use: No  . Sexual activity: Not on file  Lifestyle  . Physical activity    Days per week: Not on file    Minutes per session: Not on file  . Stress: Not on file  Relationships  . Social Musicianconnections    Talks on phone: Not on file    Gets together: Not on file    Attends religious service: Not on file    Active member of club or organization: Not on file    Attends meetings of clubs or organizations: Not on file    Relationship status: Not on file  Other Topics Concern  . Not on file  Social History Narrative  . Not on file    No past medical history on file.   Patient Active Problem List   Diagnosis Date Noted  . Chewing tobacco use 02/14/2017  . Actinic keratosis 02/08/2016  . 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 Hypogonadism male 02/03/2016  . Left hand paresthesia 02/03/2016  . Neck pain 02/03/2016  . Obesity 02/03/2016  . Benign essential tremor 02/03/2016  . Anxiety disorder 01/22/2008  . Essential (primary) hypertension 05/05/2004  .  GERD (gastroesophageal reflux disease) 05/05/2002  . Benign prostatic hypertrophy without urinary obstruction 12/04/2001    Past Surgical History:  Procedure Laterality Date  . CHOLECYSTECTOMY  01/26/2015   [facility]; Dr. Brigid ReLunquist  . GALLBLADDER SURGERY  3/16  . TONSILLECTOMY AND ADENOIDECTOMY  1960's    Family History        Family Status  Relation Name Status  . Mother  Alive  . Father  Deceased at age 60's       Died from Huntingtons Disease  . Sister  Alive  . Other grandfather Deceased        His family history includes Heart attack in an other family member; Huntington's  disease in his father.      Allergies  Allergen Reactions  . Effexor Xr  [Venlafaxine]   . Tarka  [Trandolapril-Verapamil Hcl Er]     Other reaction(s): Constipation, Nausea     Current Outpatient Medications:  .  ALPRAZolam (XANAX) 0.25 MG tablet, TAKE 1 TABLET BY MOUTH EVERY 8 HOURS AS NEEDED, Disp: 30 tablet, Rfl: 3 .  lisinopril-hydrochlorothiazide (PRINZIDE,ZESTORETIC) 20-12.5 MG tablet, TAKE 1 TABLET BY MOUTH ONCE DAILY, Disp: 90 tablet, Rfl: 4 .  meloxicam (MOBIC) 15 MG tablet, Take 1 tablet (15 mg total) by mouth daily., Disp: 30 tablet, Rfl: 0 .  omeprazole (PRILOSEC OTC) 20 MG tablet, Take 1 tablet by mouth daily., Disp: , Rfl:  .  propranolol ER (INDERAL LA) 80 MG 24 hr capsule, TAKE 1 CAPSULE(80 MG) BY MOUTH DAILY, Disp: 90 capsule, Rfl: 3 .  budesonide-formoterol (SYMBICORT) 160-4.5 MCG/ACT inhaler, Inhale 2 puffs into the lungs 2 (two) times daily., Disp: 1 Inhaler, Rfl: 0   Patient Care Team: Malva LimesFisher, Janissa Bertram E, MD as PCP - General (Family Medicine)    Objective:    Vitals: BP 138/81 (BP Location: Right Arm, Patient Position: Sitting, Cuff Size: Large)   Pulse (!) 57   Temp 98 F (36.7 C) (Oral)   Ht 6' 1.5" (1.867 m)   Wt 223 lb (101.2 kg)   BMI 29.02 kg/m    Vitals:   07/08/19 1509  BP: 138/81  Pulse: (!) 57  Temp: 98 F (36.7 C)  TempSrc: Oral  Weight: 223 lb (101.2 kg)  Height: 6' 1.5" (1.867 m)     Physical Exam   General Appearance:    Alert, cooperative, no distress, appears stated age  Head:    Normocephalic, without obvious abnormality, atraumatic  Eyes:    PERRL, conjunctiva/corneas clear, EOM's intact, fundi    benign, both eyes       Ears:    Normal TM's and external ear canals, both ears  Nose:   Nares normal, septum midline, mucosa normal, no drainage   or sinus tenderness  Throat:   Lips, mucosa, and tongue normal; teeth and gums normal  Neck:   Supple, symmetrical, trachea midline, no adenopathy;       thyroid:  No  enlargement/tenderness/nodules; no carotid   bruit or JVD  Back:     Symmetric, no curvature, ROM normal, no CVA tenderness  Lungs:     Clear to auscultation bilaterally, respirations unlabored  Chest wall:    No tenderness or deformity  Heart:    Normal heart rate. Normal rhythm. No murmurs, rubs, or gallops.  S1 and S2 normal  Abdomen:     Soft, non-tender, bowel sounds active all four quadrants,    no masses, no organomegaly  Genitalia:    deferred  Rectal:  deferred  Extremities:   Extremities normal, atraumatic, no cyanosis or edema  Pulses:   2+ and symmetric all extremities  Skin:   Skin color, texture, turgor normal, no rashes or lesions  Lymph nodes:   Cervical, supraclavicular, and axillary nodes normal  Neurologic:   CNII-XII intact. Normal strength, sensation and reflexes      throughout    Depression Screen PHQ 2/9 Scores 03/13/2018 02/14/2017 02/08/2016  PHQ - 2 Score 0 0 0  PHQ- 9 Score 1 3 3        Assessment & Plan:     Routine Health Maintenance and Physical Exam  Exercise Activities and Dietary recommendations Goals   None     Immunization History  Administered Date(s) Administered  . Influenza,inj,Quad PF,6+ Mos 09/14/2018  . Influenza-Unspecified 10/05/2015, 09/12/2017  . Td 12/04/2001  . Tdap 07/20/2009    Health Maintenance  Topic Date Due  . HIV Screening  04/22/1974  . INFLUENZA VACCINE  07/05/2019  . TETANUS/TDAP  07/21/2019  . COLONOSCOPY  11/06/2019  . Hepatitis C Screening  Completed     Discussed health benefits of physical activity, and encouraged him to engage in regular exercise appropriate for his age and condition.    --------------------------------------------------------------------  1. Annual physical exam Normal exam check labs.  - Comprehensive metabolic panel - Lipid panel - CBC  2. Benign essential tremor Well controlled. A little bradycardic and fatigued on propranolol. Discusses possibly reducing to 20mg  bid,  but hed rather stay on current dose for now.   3. Colon cancer screening Due this year for colonoscopy. Last was by Dr. Tiffany Kocher. Will refer if he doesn't get letter by October.   4. Prostate cancer screening  - PSA  5. Need for Tdap vaccination  - Tdap vaccine greater than or equal to 7yo IM  6. Need for shingles vaccine  - Varicella-zoster vaccine IM   Lelon Huh, MD  [facility]

## 2019-07-15 LAB — COMPREHENSIVE METABOLIC PANEL
ALT: 14 IU/L (ref 0–44)
AST: 16 IU/L (ref 0–40)
Albumin/Globulin Ratio: 2 (ref 1.2–2.2)
Albumin: 4.3 g/dL (ref 3.8–4.9)
Alkaline Phosphatase: 69 IU/L (ref 39–117)
BUN/Creatinine Ratio: 11 (ref 10–24)
BUN: 14 mg/dL (ref 8–27)
Bilirubin Total: 0.8 mg/dL (ref 0.0–1.2)
CO2: 23 mmol/L (ref 20–29)
Calcium: 9 mg/dL (ref 8.6–10.2)
Chloride: 100 mmol/L (ref 96–106)
Creatinine, Ser: 1.22 mg/dL (ref 0.76–1.27)
GFR calc Af Amer: 74 mL/min/{1.73_m2} (ref >59)
GFR calc non Af Amer: 64 mL/min/{1.73_m2} (ref >59)
Globulin, Total: 2.2 g/dL (ref 1.5–4.5)
Glucose: 129 mg/dL — ABNORMAL HIGH (ref 65–99)
Potassium: 4.5 mmol/L (ref 3.5–5.2)
Sodium: 136 mmol/L (ref 134–144)
Total Protein: 6.5 g/dL (ref 6.0–8.5)

## 2019-07-15 LAB — LIPID PANEL
Chol/HDL Ratio: 4.9 ratio (ref 0.0–5.0)
Cholesterol, Total: 165 mg/dL (ref 100–199)
HDL: 34 mg/dL — ABNORMAL LOW (ref >39)
LDL Calculated: 107 mg/dL — ABNORMAL HIGH (ref 0–99)
Triglycerides: 121 mg/dL (ref 0–149)
VLDL Cholesterol Cal: 24 mg/dL (ref 5–40)

## 2019-07-15 LAB — CBC
Hematocrit: 43.5 % (ref 37.5–51.0)
Hemoglobin: 14.9 g/dL (ref 13.0–17.7)
MCH: 32.4 pg (ref 26.6–33.0)
MCHC: 34.3 g/dL (ref 31.5–35.7)
MCV: 95 fL (ref 79–97)
Platelets: 200 10*3/uL (ref 150–450)
RBC: 4.6 x10E6/uL (ref 4.14–5.80)
RDW: 12.9 % (ref 11.6–15.4)
WBC: 9 10*3/uL (ref 3.4–10.8)

## 2019-07-15 LAB — PSA: Prostate Specific Ag, Serum: 1.3 ng/mL (ref 0.0–4.0)

## 2019-09-02 ENCOUNTER — Encounter: Payer: Self-pay | Admitting: Family Medicine

## 2019-09-02 DIAGNOSIS — Z1211 Encounter for screening for malignant neoplasm of colon: Secondary | ICD-10-CM

## 2019-09-04 ENCOUNTER — Other Ambulatory Visit: Payer: Self-pay | Admitting: Family Medicine

## 2019-09-08 ENCOUNTER — Other Ambulatory Visit: Payer: Self-pay

## 2019-09-08 ENCOUNTER — Ambulatory Visit (INDEPENDENT_AMBULATORY_CARE_PROVIDER_SITE_OTHER): Payer: PRIVATE HEALTH INSURANCE | Admitting: Family Medicine

## 2019-09-08 DIAGNOSIS — Z23 Encounter for immunization: Secondary | ICD-10-CM

## 2019-09-12 NOTE — Progress Notes (Signed)
Vaccine administration only. No E&M service today.   

## 2019-09-12 NOTE — Patient Instructions (Signed)
.   Please review the attached list of medications and notify my office if there are any errors.   . Please bring all of your medications to every appointment so we can make sure that our medication list is the same as yours.   . It is especially important to get the annual flu vaccine this year. If you haven't had it already, please go to your pharmacy or call the office as soon as possible to schedule you flu shot.  

## 2019-09-23 ENCOUNTER — Encounter: Payer: Self-pay | Admitting: *Deleted

## 2019-09-23 ENCOUNTER — Telehealth: Payer: Self-pay | Admitting: Family Medicine

## 2019-09-23 NOTE — Telephone Encounter (Signed)
Attempted to contact patient, no answer and voicemail has not been set up yet.   

## 2019-09-23 NOTE — Telephone Encounter (Signed)
Please advise patient that he is due for colonoscopy and see if he has heard from gi to schedule. If not the please enter order for colonoscopy referral for screening.

## 2019-09-29 NOTE — Telephone Encounter (Signed)
Patient advised. Patient states GI has attempted to contact him and he missed the call. Patient states he will call them back today to schedule colonoscopy.

## 2019-09-30 ENCOUNTER — Telehealth: Payer: PRIVATE HEALTH INSURANCE | Admitting: Physician Assistant

## 2019-09-30 ENCOUNTER — Other Ambulatory Visit: Payer: Self-pay | Admitting: *Deleted

## 2019-09-30 DIAGNOSIS — J069 Acute upper respiratory infection, unspecified: Secondary | ICD-10-CM

## 2019-09-30 DIAGNOSIS — Z20822 Contact with and (suspected) exposure to covid-19: Secondary | ICD-10-CM

## 2019-09-30 MED ORDER — ALBUTEROL SULFATE HFA 108 (90 BASE) MCG/ACT IN AERS: 2.0000 | INHALATION_SPRAY | Freq: Four times a day (QID) | RESPIRATORY_TRACT | 0 refills | Status: DC | PRN

## 2019-09-30 MED ORDER — BENZONATATE 100 MG PO CAPS: 100.0000 mg | ORAL_CAPSULE | Freq: Three times a day (TID) | ORAL | 0 refills | Status: AC

## 2019-09-30 NOTE — Progress Notes (Signed)
E-Visit for Corona Virus Screening   Your current symptoms could be consistent with the coronavirus.  Many health care providers can now test patients at their office but not all are.  [facility] has multiple testing sites. For information on our COVID testing locations and hours go to [email or web address]  Please quarantine yourself while awaiting your test results.  We are enrolling you in our MyChart Home Montioring for COVID19 . Daily you will receive a questionnaire within the MyChart website. Our COVID 19 response team willl be monitoriing your responses daily.    COVID-19 is a respiratory illness with symptoms that are similar to the flu. Symptoms are typically mild to moderate, but there have been cases of severe illness and death due to the virus. The following symptoms may appear 2-14 days after exposure: . Fever . Cough . Shortness of breath or difficulty breathing . Chills . Repeated shaking with chills . Muscle pain . Headache . Sore throat . New loss of taste or smell . Fatigue . Congestion or runny nose . Nausea or vomiting . Diarrhea  It is vitally important that if you feel that you have an infection such as this virus or any other virus that you stay home and away from places where you may spread it to others.  You should self-quarantine for 14 days if you have symptoms that could potentially be coronavirus or have been in close contact a with a person diagnosed with COVID-19 within the last 2 weeks. You should avoid contact with people age 65 and older.   You should wear a mask or cloth face covering over your nose and mouth if you must be around other people or animals, including pets (even at home). Try to stay at least 6 feet away from other people. This will protect the people around you.  You can use medication such as A prescription cough medication called Tessalon Perles 100 mg. You may take 1-2 capsules every 8 hours as needed for cough  and A prescription inhaler called Albuterol MDI 90 mcg /actuation 2 puffs every 4 hours as needed for shortness of breath, wheezing, cough  You may also take acetaminophen (Tylenol) as needed for fever.   Reduce your risk of any infection by using the same precautions used for avoiding the common cold or flu:  . Wash your hands often with soap and warm water for at least 20 seconds.  If soap and water are not readily available, use an alcohol-based hand sanitizer with at least 60% alcohol.  . If coughing or sneezing, cover your mouth and nose by coughing or sneezing into the elbow areas of your shirt or coat, into a tissue or into your sleeve (not your hands). . Avoid shaking hands with others and consider head nods or verbal greetings only. . Avoid touching your eyes, nose, or mouth with unwashed hands.  . Avoid close contact with people who are sick. . Avoid places or events with large numbers of people in one location, like concerts or sporting events. . Carefully consider travel plans you have or are making. . If you are planning any travel outside or inside the [place], visit the CDC's Travelers' Health webpage for the latest health notices. . If you have some symptoms but not all symptoms, continue to monitor at home and seek medical attention if your symptoms worsen. . If you are having a medical emergency, call 911.  HOME CARE . Only take medications as instructed by your medical team. .   Drink plenty of fluids and get plenty of rest. . A steam or ultrasonic humidifier can help if you have congestion.   GET HELP RIGHT AWAY IF YOU HAVE EMERGENCY WARNING SIGNS** FOR COVID-19. If you or someone is showing any of these signs seek emergency medical care immediately. Call 911 or proceed to your closest emergency facility if: . You develop worsening high fever. . Trouble breathing . Bluish lips or face . Persistent pain or pressure in the chest . New confusion . Inability to wake or stay  awake . You cough up blood. . Your symptoms become more severe  **This list is not all possible symptoms. Contact your medical provider for any symptoms that are sever or concerning to you.   MAKE SURE YOU   Understand these instructions.  Will watch your condition.  Will get help right away if you are not doing well or get worse.  Your e-visit answers were reviewed by a board certified advanced clinical practitioner to complete your personal care plan.  Depending on the condition, your plan could have included both over the counter or prescription medications.  If there is a problem please reply once you have received a response from your provider.  Your safety is important to us.  If you have drug allergies check your prescription carefully.    You can use MyChart to ask questions about today's visit, request a non-urgent call back, or ask for a work or school excuse for 24 hours related to this e-Visit. If it has been greater than 24 hours you will need to follow up with your provider, or enter a new e-Visit to address those concerns. You will get an e-mail in the next two days asking about your experience.  I hope that your e-visit has been valuable and will speed your recovery. Thank you for using e-visits.  Approximately 5 minutes was spent documenting and reviewing patient's chart.   

## 2019-10-02 ENCOUNTER — Other Ambulatory Visit: Payer: Self-pay

## 2019-10-02 ENCOUNTER — Telehealth: Payer: Self-pay

## 2019-10-02 DIAGNOSIS — Z1211 Encounter for screening for malignant neoplasm of colon: Secondary | ICD-10-CM

## 2019-10-02 LAB — NOVEL CORONAVIRUS, NAA: SARS-CoV-2, NAA: NOT DETECTED

## 2019-10-02 NOTE — Telephone Encounter (Signed)
Gastroenterology Pre-Procedure Review  Request Date: 11/10/19 Requesting Physician: Dr. Bonna Gains  PATIENT REVIEW QUESTIONS: The patient responded to the following health history questions as indicated:    1. Are you having any GI issues? no 2. Do you have a personal history of Polyps? no 3. Do you have a family history of Colon Cancer or Polyps? no 4. Diabetes Mellitus? no 5. Joint replacements in the past 12 months?no 6. Major health problems in the past 3 months?no 7. Any artificial heart valves, MVP, or defibrillator?no    MEDICATIONS & ALLERGIES:    Patient reports the following regarding taking any anticoagulation/antiplatelet therapy:   Plavix, Coumadin, Eliquis, Xarelto, Lovenox, Pradaxa, Brilinta, or Effient? no Aspirin? no  Patient confirms/reports the following medications:  Current Outpatient Medications  Medication Sig Dispense Refill  . albuterol (VENTOLIN HFA) 108 (90 Base) MCG/ACT inhaler Inhale 2 puffs into the lungs every 6 (six) hours as needed for wheezing or shortness of breath. 8 g 0  . ALPRAZolam (XANAX) 0.25 MG tablet TAKE 1 TABLET BY MOUTH EVERY 8 HOURS AS NEEDED 30 tablet 3  . benzonatate (TESSALON) 100 MG capsule Take 1 capsule (100 mg total) by mouth every 8 (eight) hours for 5 days. 15 capsule 0  . lisinopril-hydrochlorothiazide (PRINZIDE,ZESTORETIC) 20-12.5 MG tablet TAKE 1 TABLET BY MOUTH ONCE DAILY 90 tablet 4  . meloxicam (MOBIC) 15 MG tablet Take 1 tablet (15 mg total) by mouth daily. 30 tablet 0  . omeprazole (PRILOSEC OTC) 20 MG tablet Take 1 tablet by mouth daily.    . propranolol ER (INDERAL LA) 80 MG 24 hr capsule TAKE 1 CAPSULE(80 MG) BY MOUTH DAILY 90 capsule 3   No current facility-administered medications for this visit.     Patient confirms/reports the following allergies:  Allergies  Allergen Reactions  . Effexor Xr  [Venlafaxine]   . Tarka  [Trandolapril-Verapamil Hcl Er]     Other reaction(s): Constipation, Nausea    No orders  of the defined types were placed in this encounter.   AUTHORIZATION INFORMATION Primary Insurance: 1D#: Group #:  Secondary Insurance: 1D#: Group #:  SCHEDULE INFORMATION: Date: 11/20/19 Time: Location:[facility]

## 2019-11-05 ENCOUNTER — Telehealth: Payer: Self-pay

## 2019-11-05 NOTE — Telephone Encounter (Signed)
Patient has requested to cancel his colonoscopy until after the holidays.  He was scheduled with Dr. Bonna Gains for 11/10/19. Mark in Endo has been informed of the cancellation.  I will call him back in January to schedule him for sometime in February as he has requested to schedule then.  Thanks [employer]

## 2019-11-06 ENCOUNTER — Other Ambulatory Visit: Admission: RE | Admit: 2019-11-06 | Payer: PRIVATE HEALTH INSURANCE | Source: Ambulatory Visit

## 2019-11-10 ENCOUNTER — Encounter: Admission: RE | Payer: Self-pay | Source: Home / Self Care

## 2019-11-10 ENCOUNTER — Ambulatory Visit
Admission: RE | Admit: 2019-11-10 | Payer: PRIVATE HEALTH INSURANCE | Source: Home / Self Care | Admitting: Gastroenterology

## 2019-11-10 SURGERY — COLONOSCOPY WITH PROPOFOL
Anesthesia: General

## 2019-12-04 ENCOUNTER — Other Ambulatory Visit: Payer: Self-pay | Admitting: Family Medicine

## 2019-12-21 IMAGING — CR RIGHT FOOT COMPLETE - 3+ VIEW
1 series · 3 of 3 positions shown · non-contrast
Comparison: None.

CLINICAL DATA: Right heel pain.  History of osteomyelitis

EXAM:
RIGHT FOOT COMPLETE - 3+ VIEW

[Series 1: dg foot complete right · 0.14mm/px · 3 of 3 slices shown]
[im 1/3]
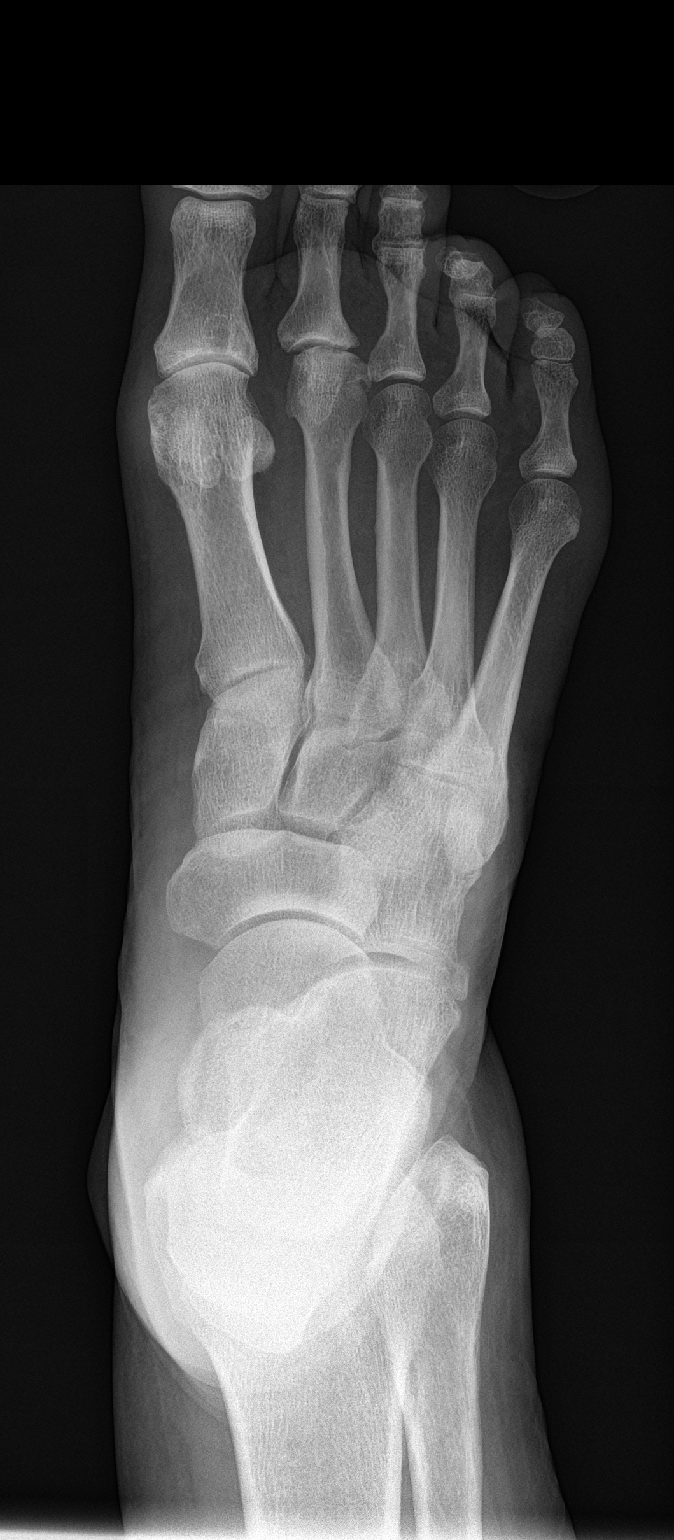
[im 2/3]
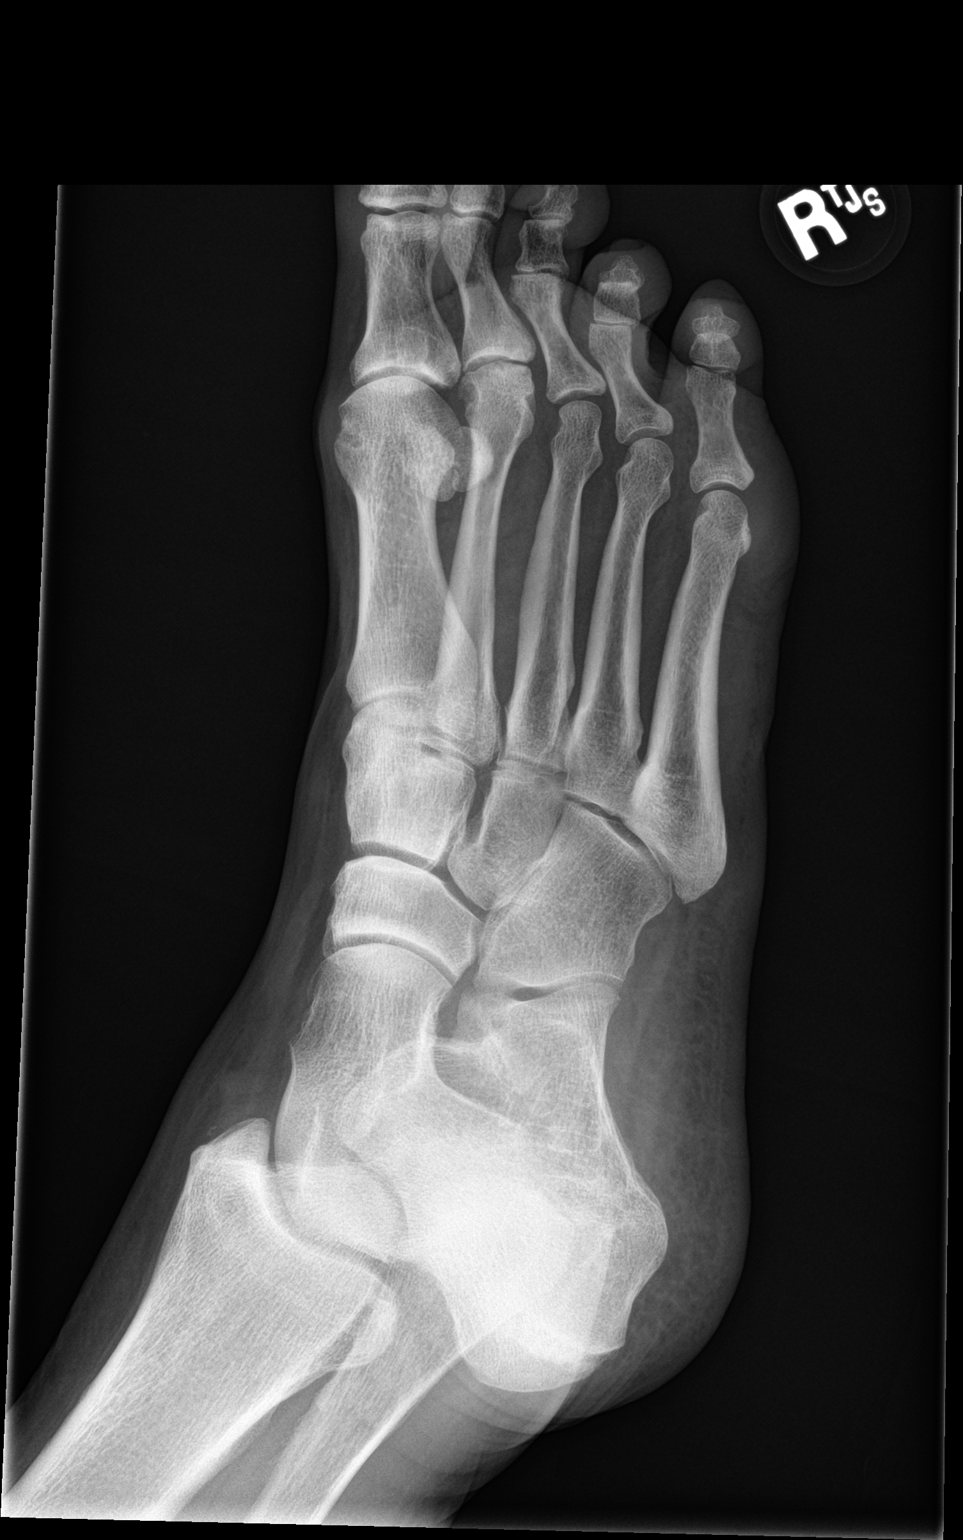
[im 3/3]
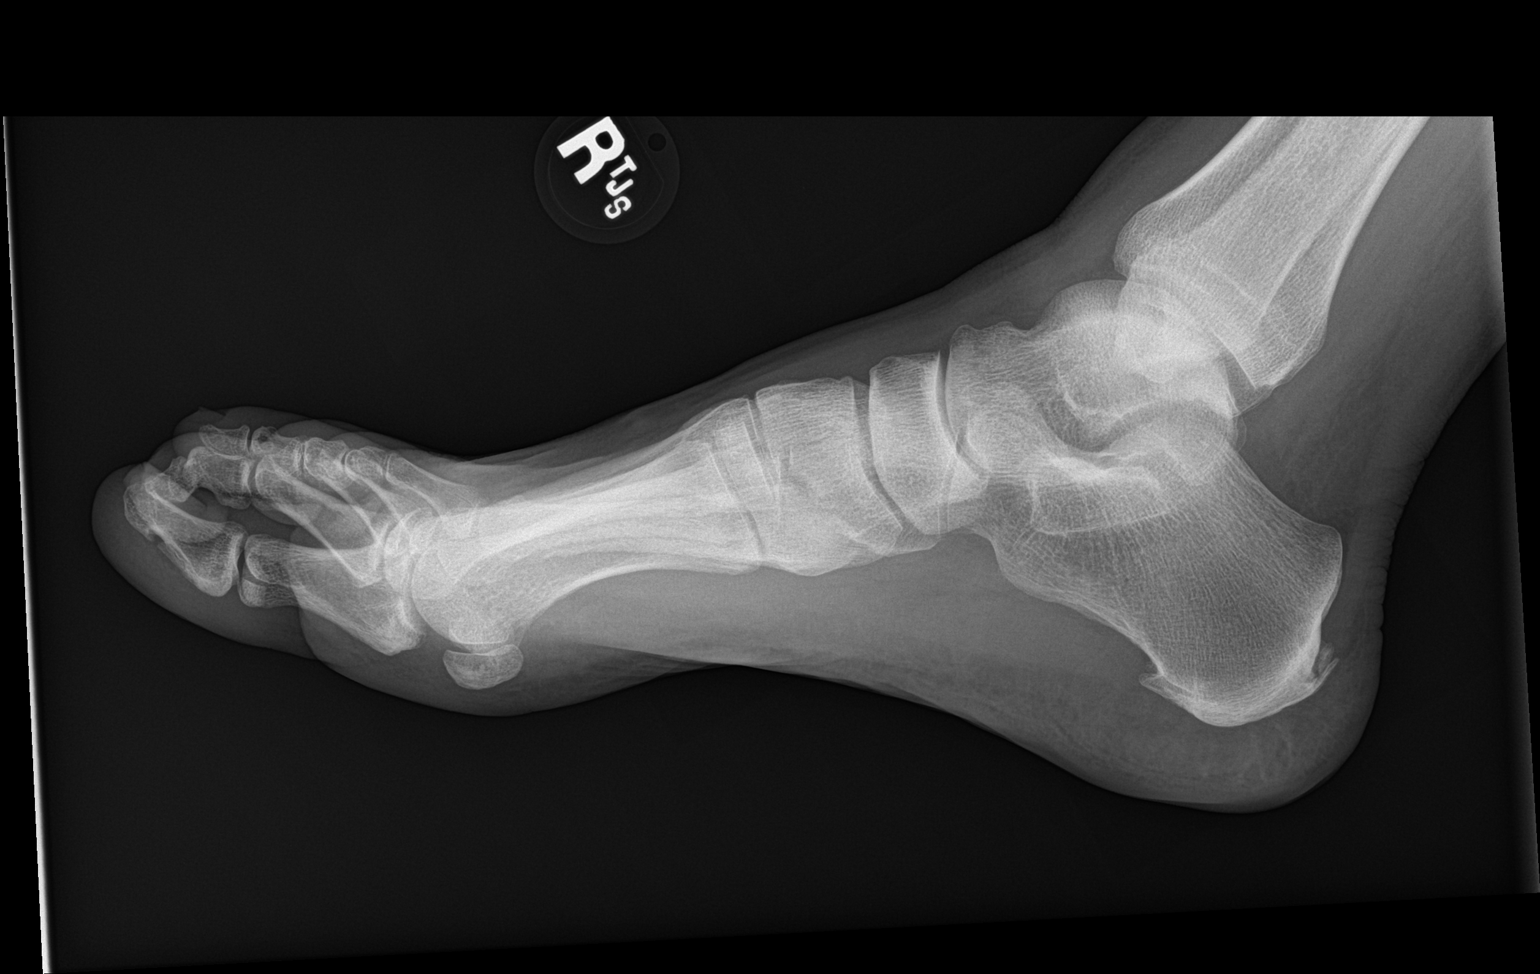

[3 of 3 positions shown; findings below may reference images not displayed]

FINDINGS: Degenerative changes at the 1st and 2nd MTP joints with joint space
narrowing and spurring. Small calcaneal spur. No acute bony
abnormality. Specifically, no fracture, subluxation, or dislocation.
IMPRESSION: No acute bony abnormality.

## 2020-03-03 ENCOUNTER — Other Ambulatory Visit: Payer: Self-pay | Admitting: Family Medicine

## 2020-03-03 DIAGNOSIS — G250 Essential tremor: Secondary | ICD-10-CM

## 2020-03-04 ENCOUNTER — Other Ambulatory Visit: Payer: Self-pay | Admitting: Family Medicine

## 2020-03-04 DIAGNOSIS — G250 Essential tremor: Secondary | ICD-10-CM

## 2020-04-06 ENCOUNTER — Other Ambulatory Visit: Payer: Self-pay | Admitting: Family Medicine

## 2020-04-06 DIAGNOSIS — G250 Essential tremor: Secondary | ICD-10-CM

## 2020-04-06 NOTE — Telephone Encounter (Signed)
Requested medication (s) are due for refill today: yes  Requested medication (s) are on the active medication list: yes  Last refill: 03/04/2020  Future visit scheduled: no  Notes to clinic:  patient has had a courtesy refill and has not schedule appointment   Requested Prescriptions  Pending Prescriptions Disp Refills   propranolol ER (INDERAL LA) 80 MG 24 hr capsule [Pharmacy Med Name: PROPRANOLOL ER 80MG  CAPSULES] 30 capsule 0    Sig: TAKE 1 CAPSULE(80 MG) BY MOUTH DAILY      Cardiovascular:  Beta Blockers Failed - 04/06/2020 12:11 PM      Failed - Valid encounter within last 6 months    Recent Outpatient Visits           7 months ago Need for influenza vaccination   [facility], [place]   9 months ago Annual physical exam   [facility], [place]   11 months ago Pain of right heel   [facility] [place], [facility], [place] Cipro   1 year ago Cough   [facility], [place]   2 years ago Annual physical exam   [facility], [place]              Passed - Last BP in normal range    BP Readings from Last 1 Encounters:  07/08/19 138/81          Passed - Last Heart Rate in normal range    Pulse Readings from Last 1 Encounters:  07/08/19 (!) 57

## 2020-05-11 ENCOUNTER — Other Ambulatory Visit: Payer: Self-pay | Admitting: Family Medicine

## 2020-05-11 DIAGNOSIS — G250 Essential tremor: Secondary | ICD-10-CM

## 2020-05-11 NOTE — Telephone Encounter (Signed)
Requested medication (s) are due for refill today: yes  Requested medication (s) are on the active medication list: yes  Last refill:  04/06/20 # 30 capsules  Future visit scheduled: no  Notes to clinic:  Called pt and unable to LM on home phone (not in service) and on mobile number (mailbox full) Overdue for visit and pt has been given 2 courtesy RF   Requested Prescriptions  Pending Prescriptions Disp Refills   propranolol ER (INDERAL LA) 80 MG 24 hr capsule [Pharmacy Med Name: PROPRANOLOL ER 80MG  CAPSULES] 30 capsule 0    Sig: TAKE 1 CAPSULE(80 MG) BY MOUTH DAILY      Cardiovascular:  Beta Blockers Failed - 05/11/2020 11:58 AM      Failed - Valid encounter within last 6 months    Recent Outpatient Visits           8 months ago Need for influenza vaccination   [facility], [place]   10 months ago Annual physical exam   [facility], [place]   1 year ago Pain of right heel   [facility] [place], [place] Cipro   2 years ago Cough   [facility], [place]   2 years ago Annual physical exam   [facility], [place]              Passed - Last BP in normal range    BP Readings from Last 1 Encounters:  07/08/19 138/81          Passed - Last Heart Rate in normal range    Pulse Readings from Last 1 Encounters:  07/08/19 (!) 57

## 2020-05-13 ENCOUNTER — Telehealth: Payer: Self-pay | Admitting: Family Medicine

## 2020-05-13 DIAGNOSIS — G250 Essential tremor: Secondary | ICD-10-CM

## 2020-05-13 NOTE — Telephone Encounter (Signed)
Patient made an appt for rx refill for medication propranolol Gave patient first availability appt.  Patient states he only has 3 calls left and needs a refill before. Call back [phone]

## 2020-05-14 ENCOUNTER — Other Ambulatory Visit: Payer: Self-pay | Admitting: Family Medicine

## 2020-05-14 DIAGNOSIS — G250 Essential tremor: Secondary | ICD-10-CM

## 2020-05-14 MED ORDER — PROPRANOLOL HCL ER 80 MG PO CP24: ORAL_CAPSULE | ORAL | 0 refills | Status: DC

## 2020-05-14 NOTE — Telephone Encounter (Signed)
Rx sent to pharmacy for a 30 day supply.

## 2020-05-19 ENCOUNTER — Other Ambulatory Visit: Payer: Self-pay

## 2020-05-19 ENCOUNTER — Encounter: Payer: Self-pay | Admitting: Family Medicine

## 2020-05-19 ENCOUNTER — Ambulatory Visit (INDEPENDENT_AMBULATORY_CARE_PROVIDER_SITE_OTHER): Payer: PRIVATE HEALTH INSURANCE | Admitting: Family Medicine

## 2020-05-19 VITALS — BP 144/80 | HR 51 | Temp 97.1°F | Ht 73.5 in | Wt 230.4 lb

## 2020-05-19 DIAGNOSIS — I10 Essential (primary) hypertension: Secondary | ICD-10-CM

## 2020-05-19 DIAGNOSIS — Z1211 Encounter for screening for malignant neoplasm of colon: Secondary | ICD-10-CM | POA: Diagnosis not present

## 2020-05-19 DIAGNOSIS — K219 Gastro-esophageal reflux disease without esophagitis: Secondary | ICD-10-CM | POA: Diagnosis not present

## 2020-05-19 DIAGNOSIS — G250 Essential tremor: Secondary | ICD-10-CM

## 2020-05-19 DIAGNOSIS — F419 Anxiety disorder, unspecified: Secondary | ICD-10-CM

## 2020-05-19 MED ORDER — LISINOPRIL-HYDROCHLOROTHIAZIDE 20-25 MG PO TABS: 1.0000 | ORAL_TABLET | Freq: Every day | ORAL | 3 refills | Status: DC

## 2020-05-19 MED ORDER — ALPRAZOLAM 0.25 MG PO TABS: 0.1250 mg | ORAL_TABLET | Freq: Three times a day (TID) | ORAL | 3 refills | Status: DC | PRN

## 2020-05-19 MED ORDER — PROPRANOLOL HCL ER 80 MG PO CP24: ORAL_CAPSULE | ORAL | 2 refills | Status: DC

## 2020-05-19 NOTE — Progress Notes (Signed)
Established patient visit   Patient: Ryan Mcmahon   DOB: 1961/09/14   59 y.o. Male  MRN: [id number] Visit Date: 05/19/2020  Today's healthcare provider: Mila Merry, MD   Chief Complaint  Patient presents with  . Hypertension  . Gastroesophageal Reflux   I,Latasha Walston,acting as a scribe for Mila Merry, MD.,have documented all relevant documentation on the behalf of Mila Merry, MD,as directed by  Mila Merry, MD while in the presence of Mila Merry, MD.   Subjective    HPI Hypertension, follow-up  BP Readings from Last 3 Encounters:  05/19/20 (!) 164/85  07/08/19 138/81  05/06/19 130/80   Wt Readings from Last 3 Encounters:  05/19/20 230 lb 6.4 oz (104.5 kg)  07/08/19 223 lb (101.2 kg)  05/06/19 224 lb (101.6 kg)     He was last seen for hypertension 10 months ago.  BP at that visit was 138/81. Management since that visit includes no change.  He reports good compliance with treatment. He is not having side effects.  He is following a Regular diet. He is exercising lightly. He does not smoke.  Use of agents associated with hypertension: none.   Outside blood pressures are being checked at home. Symptoms: No chest pain No chest pressure  No palpitations No syncope  No dyspnea No orthopnea  No paroxysmal nocturnal dyspnea Yes lower extremity edema   Pertinent labs: Lab Results  Component Value Date   CHOL 165 07/14/2019   HDL 34 (L) 07/14/2019   LDLCALC 107 (H) 07/14/2019   TRIG 121 07/14/2019   CHOLHDL 4.9 07/14/2019   Lab Results  Component Value Date   NA 136 07/14/2019   K 4.5 07/14/2019   CREATININE 1.22 07/14/2019   GFRNONAA 64 07/14/2019   GFRAA 74 07/14/2019   GLUCOSE 129 (H) 07/14/2019     The 10-year ASCVD risk score Denman George DC Jr., et al., 2013) is: 18.1%   --------------------------------------------------------------------------------------------------- GERD, Follow up:  The patient was last seen for GERD 10 months  ago. Changes made since that visit include no change.  He reports good compliance with treatment. He is not having side effects. Marland Kitchen  He IS experiencing no symptoms at this time.  ----------------------------------------------------------------------------------------- Anxiety follow up  He states alprazolam continues to be very effective which he takes a few times each week. Has no adverse effects from medication     Medications: Outpatient Medications Prior to Visit  Medication Sig  . ALPRAZolam (XANAX) 0.25 MG tablet TAKE 1 TABLET BY MOUTH EVERY 8 HOURS AS NEEDED  . lisinopril-hydrochlorothiazide (ZESTORETIC) 20-12.5 MG tablet TAKE 1 TABLET BY MOUTH EVERY DAY  . omeprazole (PRILOSEC OTC) 20 MG tablet Take 1 tablet by mouth daily.  . propranolol ER (INDERAL LA) 80 MG 24 hr capsule TAKE 1 CAPSULE(80 MG) BY MOUTH DAILY  . [DISCONTINUED] albuterol (VENTOLIN HFA) 108 (90 Base) MCG/ACT inhaler Inhale 2 puffs into the lungs every 6 (six) hours as needed for wheezing or shortness of breath.  . meloxicam (MOBIC) 15 MG tablet Take 1 tablet (15 mg total) by mouth daily. (Patient not taking: Reported on 05/19/2020)   No facility-administered medications prior to visit.    Review of Systems    Objective    BP (!) 164/85 (BP Location: Right Arm, Patient Position: Sitting, Cuff Size: Normal)   Pulse (!) 51   Temp (!) 97.1 F (36.2 C) (Temporal)   Ht 6' 1.5" (1.867 m)   Wt 230 lb 6.4 oz (104.5 kg)  BMI 29.99 kg/m    Physical Exam   General: Appearance:     Overweight male in no acute distress  Eyes:    PERRL, conjunctiva/corneas clear, EOM's intact       Lungs:     Clear to auscultation bilaterally, respirations unlabored  Heart:    Bradycardic. Normal rhythm. No murmurs, rubs, or gallops.   MS:   All extremities are intact.   Neurologic:   Awake, alert, oriented x 3. No apparent focal neurological           defect.       No results found for any visits on 05/19/20.  Assessment  & Plan    1. Essential (primary) hypertension SBP elevated today, likely related to weight gain and dietary indiscretions. He is going to work on diet and losing weight and change from 20-12.5 to - lisinopril-hydrochlorothiazide (ZESTORETIC) 20-25 MG tablet; Take 1 tablet by mouth daily.  Dispense: 90 tablet; Refill: 3  Return for CPE and BP check in 2 months.   2. Benign essential tremor Doing well with current dose of - propranolol ER (INDERAL LA) 80 MG 24 hr capsule; TAKE 1 CAPSULE(80 MG) BY MOUTH DAILY  Dispense: 90 capsule; Refill: 2  3. Gastroesophageal reflux disease, unspecified whether esophagitis present Well controlled on PPI.   4. Colon cancer screening  - Ambulatory referral to Gastroenterology  5. Anxiety Well controlled with prn- ALPRAZolam (XANAX) 0.25 MG tablet; Take 0.5-1 tablets (0.125-0.25 mg total) by mouth every 8 (eight) hours as needed.  Dispense: 30 tablet; Refill: 3        The entirety of the information documented in the History of Present Illness, Review of Systems and Physical Exam were personally obtained by me. Portions of this information were initially documented by the CMA and reviewed by me for thoroughness and accuracy.      Lelon Huh, MD  [facility] [phone] (phone) [phone] (fax)  [place]

## 2020-05-19 NOTE — Patient Instructions (Addendum)
•   Please review the attached list of medications and notify my office if there are any errors.    Please bring all of your medications to every appointment so we can make sure that our medication list is the same as yours.    Work on cutting back on portion sizes and exercising an average of 30 minutes daily to lose 10-15 pounds over the next 3 months.

## 2020-08-30 ENCOUNTER — Ambulatory Visit (INDEPENDENT_AMBULATORY_CARE_PROVIDER_SITE_OTHER): Payer: PRIVATE HEALTH INSURANCE | Admitting: Family Medicine

## 2020-08-30 ENCOUNTER — Encounter: Payer: Self-pay | Admitting: Family Medicine

## 2020-08-30 ENCOUNTER — Other Ambulatory Visit: Payer: Self-pay

## 2020-08-30 VITALS — BP 129/80 | HR 57 | Temp 98.1°F | Ht 74.0 in | Wt 228.4 lb

## 2020-08-30 DIAGNOSIS — Z0000 Encounter for general adult medical examination without abnormal findings: Secondary | ICD-10-CM | POA: Diagnosis not present

## 2020-08-30 DIAGNOSIS — M19079 Primary osteoarthritis, unspecified ankle and foot: Secondary | ICD-10-CM | POA: Insufficient documentation

## 2020-08-30 DIAGNOSIS — Z720 Tobacco use: Secondary | ICD-10-CM

## 2020-08-30 DIAGNOSIS — Z23 Encounter for immunization: Secondary | ICD-10-CM

## 2020-08-30 DIAGNOSIS — K219 Gastro-esophageal reflux disease without esophagitis: Secondary | ICD-10-CM

## 2020-08-30 DIAGNOSIS — Z125 Encounter for screening for malignant neoplasm of prostate: Secondary | ICD-10-CM

## 2020-08-30 DIAGNOSIS — G250 Essential tremor: Secondary | ICD-10-CM | POA: Diagnosis not present

## 2020-08-30 DIAGNOSIS — I10 Essential (primary) hypertension: Secondary | ICD-10-CM | POA: Diagnosis not present

## 2020-08-30 DIAGNOSIS — F419 Anxiety disorder, unspecified: Secondary | ICD-10-CM | POA: Diagnosis not present

## 2020-08-30 DIAGNOSIS — Z1211 Encounter for screening for malignant neoplasm of colon: Secondary | ICD-10-CM

## 2020-08-30 DIAGNOSIS — M19071 Primary osteoarthritis, right ankle and foot: Secondary | ICD-10-CM

## 2020-08-30 MED ORDER — BUPROPION HCL ER (SR) 150 MG PO TB12: 150.0000 mg | ORAL_TABLET | Freq: Every day | ORAL | 5 refills | Status: DC

## 2020-08-30 NOTE — Patient Instructions (Signed)
Please review the attached list of medications and notify my office if there are any errors.  ? ?Please bring all of your medications to every appointment so we can make sure that our medication list is the same as yours.  ? ?It is recommended to engage in 150 minutes of moderate exercise every week.   ?

## 2020-08-30 NOTE — Progress Notes (Signed)
Complete physical exam   Patient: Ryan Mcmahon   DOB: [birth year]   59 y.o. Male  MRN: [id number] Visit Date: 08/30/2020  Today's healthcare provider: Mila Merry, MD   Chief Complaint  Patient presents with  . Annual Exam   Subjective    Ryan Mcmahon is a 59 y.o. male who presents today for a complete physical exam.  He reports consuming a general diet. The patient has a physically strenuous job, but has no regular exercise apart from work.  He generally feels well. He reports sleeping fairly well. He does not have additional problems to discuss today.   HPI  He also had hypertension and is tolerating lisinopril-hctz well without adverse effects. Home /BP have been mostly normal. He is also on  Propranolol daily for resting tremor which he is tolerating well and seems to be helpful   He takes 1/2 of alprazolam tablet most days which he feels works well.   He continues to chew tobacco but would like to stop. He tried nicotine gum which didn't help. He took bupropion a while ago and states one tablet was ok, but he he felt weird when he went up to 2 tablets. He would like to try again but stick to lower dose.   He states he does get eye exam done every year.   He is due for colonoscopy and states he is scheduled to have this done next month   No past medical history on file. Past Surgical History:  Procedure Laterality Date  . CHOLECYSTECTOMY  01/26/2015   [facility]; Dr. Brigid Re  . GALLBLADDER SURGERY  3/16  . TONSILLECTOMY AND ADENOIDECTOMY  1960's   Social History   Socioeconomic History  . Marital status: Married    Spouse name: Not on file  . Number of children: 2  . Years of education: Not on file  . Highest education level: Not on file  Occupational History  . Not on file  Tobacco Use  . Smoking status: Never Smoker  . Smokeless tobacco: Current User    Types: Chew  Vaping Use  . Vaping Use: Never used  Substance and Sexual Activity  . Alcohol use: Yes      Alcohol/week: 0.0 standard drinks    Comment: occasional use  . Drug use: No  . Sexual activity: Not on file  Other Topics Concern  . Not on file  Social History Narrative  . Not on file   Social Determinants of Health   Financial Resource Strain:   . Difficulty of Paying Living Expenses: Not on file  Food Insecurity:   . Worried About Programme researcher, broadcasting/film/video in the Last Year: Not on file  . Ran Out of Food in the Last Year: Not on file  Transportation Needs:   . Lack of Transportation (Medical): Not on file  . Lack of Transportation (Non-Medical): Not on file  Physical Activity:   . Days of Exercise per Week: Not on file  . Minutes of Exercise per Session: Not on file  Stress:   . Feeling of Stress : Not on file  Social Connections:   . Frequency of Communication with Friends and Family: Not on file  . Frequency of Social Gatherings with Friends and Family: Not on file  . Attends Religious Services: Not on file  . Active Member of Clubs or Organizations: Not on file  . Attends Banker Meetings: Not on file  . Marital Status: Not on file  Intimate Partner Violence:   . Fear of Current or Ex-Partner: Not on file  . Emotionally Abused: Not on file  . Physically Abused: Not on file  . Sexually Abused: Not on file   Family Status  Relation Name Status  . Mother  Alive  . Father  Deceased at age 18's       Died from Huntingtons Disease  . Sister  Alive  . Other grandfather Deceased   Family History  Problem Relation Age of Onset  . Huntington's disease Father   . Heart attack Other    Allergies  Allergen Reactions  . Effexor Xr  [Venlafaxine]   . Tarka  [Trandolapril-Verapamil Hcl Er]     Other reaction(s): Constipation, Nausea    Patient Care Team: Malva Limes, MD as PCP - General (Family Medicine)   Medications: Outpatient Medications Prior to Visit  Medication Sig  . ALPRAZolam (XANAX) 0.25 MG tablet Take 0.5-1 tablets (0.125-0.25 mg  total) by mouth every 8 (eight) hours as needed.  Marland Kitchen lisinopril-hydrochlorothiazide (ZESTORETIC) 20-25 MG tablet Take 1 tablet by mouth daily.  Marland Kitchen omeprazole (PRILOSEC OTC) 20 MG tablet Take 1 tablet by mouth daily.  . propranolol ER (INDERAL LA) 80 MG 24 hr capsule TAKE 1 CAPSULE(80 MG) BY MOUTH DAILY  . meloxicam (MOBIC) 15 MG tablet Take 1 tablet (15 mg total) by mouth daily. (Patient not taking: Reported on 05/19/2020)   No facility-administered medications prior to visit.    Review of Systems  Constitutional: Negative.   HENT: Negative.   Eyes: Negative.   Respiratory: Negative.   Cardiovascular: Negative.   Gastrointestinal: Positive for abdominal distention.  Endocrine: Negative.   Genitourinary: Negative.   Musculoskeletal: Negative.   Skin: Negative.   Allergic/Immunologic: Negative.   Neurological: Positive for tremors.  Psychiatric/Behavioral: The patient is nervous/anxious.      Objective    BP 129/80 (BP Location: Right Arm, Patient Position: Sitting, Cuff Size: Large)   Pulse (!) 57   Temp 98.1 F (36.7 C) (Oral)   Ht 6\' 2"  (1.88 m)   Wt 228 lb 6.4 oz (103.6 kg)   BMI 29.32 kg/m   Physical Exam   General Appearance:     Well developed, well nourished male. Alert, cooperative, in no acute distress, appears stated age  Head:    Normocephalic, without obvious abnormality, atraumatic  Eyes:    PERRL, conjunctiva/corneas clear, EOM's intact, fundi    benign, both eyes       Ears:    Normal TM's and external ear canals, both ears  Nose:   Nares normal, septum midline, mucosa normal, no drainage   or sinus tenderness  Throat:   Lips, mucosa, and tongue normal; teeth and gums normal  Neck:   Supple, symmetrical, trachea midline, no adenopathy;       thyroid:  No enlargement/tenderness/nodules; no carotid   bruit or JVD  Back:     Symmetric, no curvature, ROM normal, no CVA tenderness  Lungs:     Clear to auscultation bilaterally, respirations unlabored  Chest  wall:    No tenderness or deformity  Heart:    Bradycardic. Normal rhythm. No murmurs, rubs, or gallops.  S1 and S2 normal  Abdomen:     Soft, non-tender, bowel sounds active all four quadrants,    no masses, no organomegaly  Genitalia:    deferred  Rectal:    deferred  Extremities:   All extremities are intact. No cyanosis or edema  Pulses:  2+ and symmetric all extremities  Skin:   Skin color, texture, turgor normal, no rashes or lesions  Lymph nodes:   Cervical, supraclavicular, and axillary nodes normal  Neurologic:   CNII-XII intact. Normal strength, sensation and reflexes      throughout     Last depression screening scores PHQ 2/9 Scores 07/08/2019 03/13/2018 02/14/2017  PHQ - 2 Score 0 0 0  PHQ- 9 Score 1 1 3    Last fall risk screening Fall Risk  07/08/2019  Falls in the past year? 0  Follow up Falls evaluation completed   Last Audit-C alcohol use screening Alcohol Use Disorder Test (AUDIT) 07/08/2019  1. How often do you have a drink containing alcohol? 3  2. How many drinks containing alcohol do you have on a typical day when you are drinking? 0  3. How often do you have six or more drinks on one occasion? 1  AUDIT-C Score 4   A score of 3 or more in women, and 4 or more in men indicates increased risk for alcohol abuse, EXCEPT if all of the points are from question 1   No results found for any visits on 08/30/20.  Assessment & Plan    Routine Health Maintenance and Physical Exam  Exercise Activities and Dietary recommendations Goals   None     Immunization History  Administered Date(s) Administered  . Influenza,inj,Quad PF,6+ Mos 09/14/2018, 09/08/2019  . Influenza-Unspecified 10/05/2015, 09/12/2017  . Td 12/04/2001  . Tdap 07/20/2009, 07/08/2019  . Zoster Recombinat (Shingrix) 07/08/2019, 09/08/2019    Health Maintenance  Topic Date Due  . HIV Screening  Never done  . COLONOSCOPY  11/06/2019  . INFLUENZA VACCINE  07/04/2020  . TETANUS/TDAP  07/07/2029   . Hepatitis C Screening  Completed    Discussed health benefits of physical activity, and encouraged him to engage in regular exercise appropriate for his age and condition.  1. Annual physical exam   2. Benign essential tremor Doing well with propranolol - EKG 12-Lead  3. Essential (primary) hypertension Well controlled.  Continue current medications.   - CBC - Comprehensive metabolic panel - Lipid panel - TSH  4. Gastroesophageal reflux disease, unspecified whether esophagitis present Well controlled.  Continue current PPI  5. Anxiety disorder, unspecified type Doing fairly well with 1/2 alprazolam most days.   6. Chewing tobacco use Try Bupropion 150 daily. He did not tolerate 300mg  daily dose in the past.   7. Need for influenza vaccination  - Flu Vaccine QUAD 6+ mos PF IM (Fluarix Quad PF)  8. Prostate cancer screening  - PSA Total (Reflex To Free) ([facility] only)  9. Colon cancer screening He states he has colonoscopy scheduled next months.   10. Osteoarthritis of right foot, unspecified osteoarthritis type Continue regular follow up with podiatry. This is likely due to significant OA of MTPs with spurring seen on previous xrays.           09/06/2029, MD  [facility] [phone] (phone) [phone] (fax)  [facility]

## 2020-09-01 LAB — TSH: TSH: 2.05 u[IU]/mL (ref 0.450–4.500)

## 2020-09-01 LAB — CBC
Hematocrit: 43.8 % (ref 37.5–51.0)
Hemoglobin: 15.2 g/dL (ref 13.0–17.7)
MCH: 32.5 pg (ref 26.6–33.0)
MCHC: 34.7 g/dL (ref 31.5–35.7)
MCV: 94 fL (ref 79–97)
Platelets: 232 10*3/uL (ref 150–450)
RBC: 4.67 x10E6/uL (ref 4.14–5.80)
RDW: 13.1 % (ref 11.6–15.4)
WBC: 9.9 10*3/uL (ref 3.4–10.8)

## 2020-09-01 LAB — COMPREHENSIVE METABOLIC PANEL
ALT: 18 IU/L (ref 0–44)
AST: 16 IU/L (ref 0–40)
Albumin/Globulin Ratio: 1.9 (ref 1.2–2.2)
Albumin: 4.5 g/dL (ref 3.8–4.8)
Alkaline Phosphatase: 84 IU/L (ref 44–121)
BUN/Creatinine Ratio: 12 (ref 10–24)
BUN: 13 mg/dL (ref 8–27)
Bilirubin Total: 0.7 mg/dL (ref 0.0–1.2)
CO2: 24 mmol/L (ref 20–29)
Calcium: 9.1 mg/dL (ref 8.6–10.2)
Chloride: 100 mmol/L (ref 96–106)
Creatinine, Ser: 1.12 mg/dL (ref 0.76–1.27)
GFR calc Af Amer: 82 mL/min/{1.73_m2} (ref >59)
GFR calc non Af Amer: 71 mL/min/{1.73_m2} (ref >59)
Globulin, Total: 2.4 g/dL (ref 1.5–4.5)
Glucose: 117 mg/dL — ABNORMAL HIGH (ref 65–99)
Potassium: 4.5 mmol/L (ref 3.5–5.2)
Sodium: 138 mmol/L (ref 134–144)
Total Protein: 6.9 g/dL (ref 6.0–8.5)

## 2020-09-01 LAB — LIPID PANEL
Chol/HDL Ratio: 4.9 ratio (ref 0.0–5.0)
Cholesterol, Total: 177 mg/dL (ref 100–199)
HDL: 36 mg/dL — ABNORMAL LOW (ref >39)
LDL Chol Calc (NIH): 119 mg/dL — ABNORMAL HIGH (ref 0–99)
Triglycerides: 118 mg/dL (ref 0–149)
VLDL Cholesterol Cal: 22 mg/dL (ref 5–40)

## 2020-09-01 LAB — PSA TOTAL (REFLEX TO FREE): Prostate Specific Ag, Serum: 1.5 ng/mL (ref 0.0–4.0)

## 2020-11-15 LAB — HM COLONOSCOPY

## 2020-11-24 ENCOUNTER — Telehealth: Payer: PRIVATE HEALTH INSURANCE | Admitting: Family

## 2020-11-24 DIAGNOSIS — J069 Acute upper respiratory infection, unspecified: Secondary | ICD-10-CM | POA: Diagnosis not present

## 2020-11-24 MED ORDER — FLUTICASONE PROPIONATE 50 MCG/ACT NA SUSP: 2.0000 | Freq: Every day | NASAL | 6 refills | Status: DC

## 2020-11-24 MED ORDER — BENZONATATE 100 MG PO CAPS: 100.0000 mg | ORAL_CAPSULE | Freq: Three times a day (TID) | ORAL | 0 refills | Status: DC | PRN

## 2020-11-24 NOTE — Progress Notes (Signed)

## 2020-12-01 ENCOUNTER — Other Ambulatory Visit: Payer: PRIVATE HEALTH INSURANCE

## 2020-12-01 DIAGNOSIS — Z20822 Contact with and (suspected) exposure to covid-19: Secondary | ICD-10-CM

## 2020-12-03 LAB — SARS-COV-2, NAA 2 DAY TAT

## 2020-12-03 LAB — NOVEL CORONAVIRUS, NAA: SARS-CoV-2, NAA: NOT DETECTED

## 2020-12-05 ENCOUNTER — Encounter: Payer: Self-pay | Admitting: Family Medicine

## 2020-12-08 ENCOUNTER — Other Ambulatory Visit: Payer: Self-pay | Admitting: Family Medicine

## 2020-12-09 ENCOUNTER — Other Ambulatory Visit: Payer: PRIVATE HEALTH INSURANCE

## 2020-12-09 ENCOUNTER — Other Ambulatory Visit: Payer: Self-pay

## 2020-12-09 DIAGNOSIS — Z20822 Contact with and (suspected) exposure to covid-19: Secondary | ICD-10-CM

## 2020-12-13 LAB — NOVEL CORONAVIRUS, NAA: SARS-CoV-2, NAA: DETECTED — AB

## 2020-12-17 ENCOUNTER — Encounter: Payer: Self-pay | Admitting: Family Medicine

## 2020-12-17 DIAGNOSIS — Z86010 Personal history of colonic polyps: Secondary | ICD-10-CM | POA: Insufficient documentation

## 2021-01-14 ENCOUNTER — Telehealth (INDEPENDENT_AMBULATORY_CARE_PROVIDER_SITE_OTHER): Payer: PRIVATE HEALTH INSURANCE | Admitting: Family Medicine

## 2021-01-14 ENCOUNTER — Encounter: Payer: Self-pay | Admitting: Family Medicine

## 2021-01-14 DIAGNOSIS — J0191 Acute recurrent sinusitis, unspecified: Secondary | ICD-10-CM | POA: Diagnosis not present

## 2021-01-14 DIAGNOSIS — R059 Cough, unspecified: Secondary | ICD-10-CM | POA: Diagnosis not present

## 2021-01-14 DIAGNOSIS — Z8616 Personal history of COVID-19: Secondary | ICD-10-CM

## 2021-01-14 HISTORY — DX: Personal history of COVID-19: Z86.16

## 2021-01-14 MED ORDER — AMOXICILLIN-POT CLAVULANATE 875-125 MG PO TABS: 1.0000 | ORAL_TABLET | Freq: Two times a day (BID) | ORAL | 0 refills | Status: AC

## 2021-01-14 NOTE — Progress Notes (Signed)
MyChart Video Visit    Virtual Visit via Video Note   This visit type was conducted due to national recommendations for restrictions regarding the COVID-19 Pandemic (e.g. social distancing) in an effort to limit this patient's exposure and mitigate transmission in our community. This patient is at least at moderate risk for complications without adequate follow up. This format is felt to be most appropriate for this patient at this time. Physical exam was limited by quality of the video and audio technology used for the visit.   Patient location: home Provider location: [facility]  I discussed the limitations of evaluation and management by telemedicine and the availability of in person appointments. The patient expressed understanding and agreed to proceed.  Patient: Ryan Mcmahon   DOB: 1961-04-15   60 y.o. Male  MRN: [id number] Visit Date: 01/14/2021  Today's healthcare provider: Mila Merry, MD   Chief Complaint  Patient presents with  . Cough   Subjective    Cough This is a recurrent problem. Episode onset: over 1 month ago. The problem has been waxing and waning. Associated symptoms include postnasal drip and rhinorrhea. Pertinent negatives include no chest pain, chills, fever, sore throat, shortness of breath or wheezing. Treatments tried: OTC Robitussin DM, Coricidin HBP. The treatment provided moderate relief.    Patient tested positive for COVID-19  on 12/09/2020. He has about one day of fever associating with persistent cough. He states that he still has a lingering cough that he cannot get rid of. He says the cough and nasal congestion are worse in the mornings. Has yellow-green nasal drainage. Having some sinus drainage. Cough feels like it's coming from the back of his throat, no chest congestion.    Medications: Outpatient Medications Prior to Visit  Medication Sig  . ALPRAZolam (XANAX) 0.25 MG tablet TAKE 1/2 TO 1 TABLET(0.125 TO 0.25 MG) BY MOUTH EVERY 8 HOURS AS  NEEDED  . buPROPion (WELLBUTRIN SR) 150 MG 12 hr tablet Take 1 tablet (150 mg total) by mouth daily. Stop nicotine 14 days after starting medication  . lisinopril-hydrochlorothiazide (ZESTORETIC) 20-25 MG tablet Take 1 tablet by mouth daily.  Marland Kitchen omeprazole (PRILOSEC OTC) 20 MG tablet Take 1 tablet by mouth daily.  . propranolol ER (INDERAL LA) 80 MG 24 hr capsule TAKE 1 CAPSULE(80 MG) BY MOUTH DAILY  . fluticasone (FLONASE) 50 MCG/ACT nasal spray Place 2 sprays into both nostrils daily. (Patient not taking: Reported on 01/14/2021)  . [DISCONTINUED] benzonatate (TESSALON PERLES) 100 MG capsule Take 1 capsule (100 mg total) by mouth 3 (three) times daily as needed. (Patient not taking: Reported on 01/14/2021)   No facility-administered medications prior to visit.    Review of Systems  Constitutional: Negative for appetite change, chills, diaphoresis, fatigue and fever.  HENT: Positive for congestion (nasal congestion in the mornings and evenings), postnasal drip and rhinorrhea. Negative for sinus pressure, sinus pain and sore throat.   Respiratory: Positive for cough (productive with thick green mucus). Negative for chest tightness, shortness of breath and wheezing.   Cardiovascular: Negative for chest pain and palpitations.  Gastrointestinal: Negative for abdominal pain, nausea and vomiting.     Objective    There were no vitals taken for this visit.   Physical Exam   Awake, alert, oriented x 3. In no apparent distress   Assessment & Plan     1. History of 2019 novel coronavirus disease (COVID-19)   2. Cough More consistent with upper airway cough than LRI or Covid. Preceded  positive cough test by a couple of weeks. Will treat for sinusitis as below and consider lung xr if not better when finished.   3. Acute recurrent sinusitis, unspecified location  - amoxicillin-clavulanate (AUGMENTIN) 875-125 MG tablet; Take 1 tablet by mouth 2 (two) times daily for 10 days.  Dispense: 20  tablet; Refill: 0        I discussed the assessment and treatment plan with the patient. The patient was provided an opportunity to ask questions and all were answered. The patient agreed with the plan and demonstrated an understanding of the instructions.   The patient was advised to call back or seek an in-person evaluation if the symptoms worsen or if the condition fails to improve as anticipated.  I provided 14 minutes of non-face-to-face time during this encounter.  The entirety of the information documented in the History of Present Illness, Review of Systems and Physical Exam were personally obtained by me. Portions of this information were initially documented by the CMA and reviewed by me for thoroughness and accuracy.     Mila Merry, MD [facility] [phone] (phone) [phone] (fax)  [facility]

## 2021-02-03 ENCOUNTER — Telehealth: Payer: Self-pay | Admitting: Family Medicine

## 2021-02-03 ENCOUNTER — Encounter: Payer: Self-pay | Admitting: Family Medicine

## 2021-02-03 DIAGNOSIS — R739 Hyperglycemia, unspecified: Secondary | ICD-10-CM

## 2021-02-03 NOTE — Telephone Encounter (Signed)
Patient was sent MyChart message that he is due to check a1c and renal. Please print and leave order for him at the front desk.

## 2021-02-03 NOTE — Telephone Encounter (Signed)
Done. Lab slip printed and left up front.

## 2021-02-20 ENCOUNTER — Other Ambulatory Visit: Payer: Self-pay | Admitting: Family Medicine

## 2021-02-20 DIAGNOSIS — I10 Essential (primary) hypertension: Secondary | ICD-10-CM

## 2021-03-08 ENCOUNTER — Other Ambulatory Visit: Payer: Self-pay | Admitting: Family Medicine

## 2021-03-08 DIAGNOSIS — G250 Essential tremor: Secondary | ICD-10-CM

## 2021-06-04 ENCOUNTER — Other Ambulatory Visit: Payer: Self-pay | Admitting: Family Medicine

## 2021-06-04 NOTE — Telephone Encounter (Signed)
dc'd 05/19/20 "change in therapy" Mila Merry ND

## 2021-06-07 ENCOUNTER — Other Ambulatory Visit: Payer: Self-pay | Admitting: Family Medicine

## 2021-06-07 DIAGNOSIS — I10 Essential (primary) hypertension: Secondary | ICD-10-CM

## 2021-06-07 MED ORDER — LISINOPRIL-HYDROCHLOROTHIAZIDE 20-25 MG PO TABS: 1.0000 | ORAL_TABLET | Freq: Every day | ORAL | 0 refills | Status: DC

## 2021-06-07 NOTE — Telephone Encounter (Signed)
Copied from CRM [id number]. Topic: Quick Communication - Rx Refill/Question >> Jun 07, 2021 12:09 PM Ryan Mcmahon A wrote: Medication: lisinopril-hydrochlorothiazide (ZESTORETIC) 20-25 MG tablet  Has the patient contacted their pharmacy? Yes.   (Agent: If no, request that the patient contact the pharmacy for the refill.) (Agent: If yes, when and what did the pharmacy advise?)  Preferred Pharmacy (with phone number or street name): [id number] - [place], [place] - [address] AT [facility] [address]  Phone:  [phone] Fax:  [phone]   Agent: Please be advised that RX refills may take up to 3 business days. We ask that you follow-up with your pharmacy.

## 2021-06-07 NOTE — Telephone Encounter (Signed)
Requested medication (s) are due for refill today - yes  Requested medication (s) are on the active medication list -yes  Future visit scheduled -no  Last refill: 05/19/20  Notes to clinic: Expired Rx- patient may not be due next visit until Sept- sent for review   Requested Prescriptions  Pending Prescriptions Disp Refills   lisinopril-hydrochlorothiazide (ZESTORETIC) 20-25 MG tablet 90 tablet 3    Sig: Take 1 tablet by mouth daily.      Cardiovascular:  ACEI + Diuretic Combos Failed - 06/07/2021 12:22 PM      Failed - Na in normal range and within 180 days    Sodium  Date Value Ref Range Status  08/31/2020 138 134 - 144 mmol/L Final          Failed - K in normal range and within 180 days    Potassium  Date Value Ref Range Status  08/31/2020 4.5 3.5 - 5.2 mmol/L Final          Failed - Cr in normal range and within 180 days    Creatinine, Ser  Date Value Ref Range Status  08/31/2020 1.12 0.76 - 1.27 mg/dL Final          Failed - Ca in normal range and within 180 days    Calcium  Date Value Ref Range Status  08/31/2020 9.1 8.6 - 10.2 mg/dL Final          Passed - Patient is not pregnant      Passed - Last BP in normal range    BP Readings from Last 1 Encounters:  08/30/20 129/80          Passed - Valid encounter within last 6 months    Recent Outpatient Visits           4 months ago History of 2019 novel coronavirus disease (COVID-19)   [facility] Fisher, Demetrios Isaacs, MD   9 months ago Annual physical exam   [facility] Malva Limes, MD   1 year ago Essential (primary) hypertension   [facility] Malva Limes, MD   1 year ago Need for influenza vaccination   [facility] Malva Limes, MD   1 year ago Annual physical exam   [facility] Malva Limes, MD                    Requested Prescriptions  Pending Prescriptions Disp Refills    lisinopril-hydrochlorothiazide (ZESTORETIC) 20-25 MG tablet 90 tablet 3    Sig: Take 1 tablet by mouth daily.      Cardiovascular:  ACEI + Diuretic Combos Failed - 06/07/2021 12:22 PM      Failed - Na in normal range and within 180 days    Sodium  Date Value Ref Range Status  08/31/2020 138 134 - 144 mmol/L Final          Failed - K in normal range and within 180 days    Potassium  Date Value Ref Range Status  08/31/2020 4.5 3.5 - 5.2 mmol/L Final          Failed - Cr in normal range and within 180 days    Creatinine, Ser  Date Value Ref Range Status  08/31/2020 1.12 0.76 - 1.27 mg/dL Final          Failed - Ca in normal range and within 180 days    Calcium  Date Value Ref Range Status  08/31/2020  9.1 8.6 - 10.2 mg/dL Final          Passed - Patient is not pregnant      Passed - Last BP in normal range    BP Readings from Last 1 Encounters:  08/30/20 129/80          Passed - Valid encounter within last 6 months    Recent Outpatient Visits           4 months ago History of 2019 novel coronavirus disease (COVID-19)   [facility] Fisher, Demetrios Isaacs, MD   9 months ago Annual physical exam   [facility] Malva Limes, MD   1 year ago Essential (primary) hypertension   [facility] Malva Limes, MD   1 year ago Need for influenza vaccination   [facility] Malva Limes, MD   1 year ago Annual physical exam   [facility] Malva Limes, MD

## 2021-07-26 ENCOUNTER — Other Ambulatory Visit: Payer: Self-pay | Admitting: Family Medicine

## 2021-07-26 NOTE — Telephone Encounter (Signed)
Requested medications are due for refill today.  yes  Requested medications are on the active medications list.  yes  Last refill. 12/08/2020  Future visit scheduled.   no  Notes to clinic.  Medication not delegated. 

## 2021-09-04 ENCOUNTER — Other Ambulatory Visit: Payer: Self-pay | Admitting: Family Medicine

## 2021-09-04 DIAGNOSIS — I10 Essential (primary) hypertension: Secondary | ICD-10-CM

## 2021-09-04 NOTE — Telephone Encounter (Signed)
Pt > 3 months overdue for OV. Called pt and LM on VM to call office to make appt.  Last RF 06/07/21.

## 2021-09-05 ENCOUNTER — Other Ambulatory Visit: Payer: Self-pay | Admitting: Family Medicine

## 2021-09-05 DIAGNOSIS — I10 Essential (primary) hypertension: Secondary | ICD-10-CM

## 2021-09-06 NOTE — Telephone Encounter (Signed)
I called pt and left a voicemail to call [facility] and make an appt for his physical.   Dr. Sherrie Mustache gave him a 30 day supply of the Zestoretic 20/25 mg on 09/05/2021.

## 2021-09-16 ENCOUNTER — Telehealth: Payer: PRIVATE HEALTH INSURANCE | Admitting: Emergency Medicine

## 2021-09-16 DIAGNOSIS — J069 Acute upper respiratory infection, unspecified: Secondary | ICD-10-CM | POA: Diagnosis not present

## 2021-09-16 MED ORDER — BENZONATATE 100 MG PO CAPS
100.0000 mg | ORAL_CAPSULE | Freq: Two times a day (BID) | ORAL | 0 refills | Status: DC | PRN
Start: 2021-09-16 — End: 2022-01-16

## 2021-09-16 MED ORDER — FLUTICASONE PROPIONATE 50 MCG/ACT NA SUSP: 2.0000 | Freq: Every day | NASAL | 0 refills | Status: DC

## 2021-09-16 NOTE — Progress Notes (Signed)
E-Visit for Upper Respiratory Infection   We are sorry you are not feeling well.  Here is how we plan to help!  Based on what you have shared with me, it looks like you may have a viral upper respiratory infection.  Upper respiratory infections are caused by a large number of viruses; however, rhinovirus is the most common cause.   Symptoms vary from person to person, with common symptoms including sore throat, cough, fatigue or lack of energy and feeling of general discomfort.  A low-grade fever of up to 100.4 may present, but is often uncommon.  Symptoms vary however, and are closely related to a person's age or underlying illnesses.  The most common symptoms associated with an upper respiratory infection are nasal discharge or congestion, cough, sneezing, headache and pressure in the ears and face.  These symptoms usually persist for about 3 to 10 days, but can last up to 2 weeks.  It is important to know that upper respiratory infections do not cause serious illness or complications in most cases.    Upper respiratory infections can be transmitted from person to person, with the most common method of transmission being a person's hands.  The virus is able to live on the skin and can infect other persons for up to 2 hours after direct contact.  Also, these can be transmitted when someone coughs or sneezes; thus, it is important to cover the mouth to reduce this risk.  To keep the spread of the illness at bay, good hand hygiene is very important.  This is an infection that is most likely caused by a virus. There are no specific treatments other than to help you with the symptoms until the infection runs its course.  We are sorry you are not feeling well.  Here is how we plan to help!   For nasal congestion, you may use an oral decongestants such as Mucinex D or if you have glaucoma or high blood pressure use plain Mucinex.  Saline nasal spray or nasal drops can help and can safely be used as often as  needed for congestion.  For your congestion, I have prescribed Fluticasone nasal spray one spray in each nostril twice a day  If you do not have a history of heart disease, hypertension, diabetes or thyroid disease, prostate/bladder issues or glaucoma, you may also use Sudafed to treat nasal congestion.  It is highly recommended that you consult with a pharmacist or your primary care physician to ensure this medication is safe for you to take.     If you have a cough, you may use cough suppressants such as Delsym and Robitussin.  If you have glaucoma or high blood pressure, you can also use Coricidin HBP.   For cough I have prescribed for you A prescription cough medication called Tessalon Perles 100 mg. You may take 1-2 capsules every 8 hours as needed for cough  If you have a sore or scratchy throat, use a saltwater gargle-  to  teaspoon of salt dissolved in a 4-ounce to 8-ounce glass of warm water.  Gargle the solution for approximately 15-30 seconds and then spit.  It is important not to swallow the solution.  You can also use throat lozenges/cough drops and Chloraseptic spray to help with throat pain or discomfort.  Warm or cold liquids can also be helpful in relieving throat pain.  For headache, pain or general discomfort, you can use Ibuprofen or Tylenol as directed.   Some authorities believe   that zinc sprays or the use of Echinacea may shorten the course of your symptoms.  Providers prescribe antibiotics to treat infections caused by bacteria. Antibiotics are very powerful in treating bacterial infections when they are used properly. To maintain their effectiveness, they should be used only when necessary. Overuse of antibiotics has resulted in the development of superbugs that are resistant to treatment!    After careful review of your answers, I would not recommend an antibiotic for your condition.  Antibiotics are not effective against viruses and therefore should not be used to treat  them. Common examples of infections caused by viruses include colds and flu  If your symptoms persist longer than 10 days or if you spike a fever greater than 100.3, let us know.  At that point you may qualify for an antibiotic.   HOME CARE Only take medications as instructed by your medical team. Be sure to drink plenty of fluids. Water is fine as well as fruit juices, sodas and electrolyte beverages. You may want to stay away from caffeine or alcohol. If you are nauseated, try taking small sips of liquids. How do you know if you are getting enough fluid? Your urine should be a pale yellow or almost colorless. Get rest. Taking a steamy shower or using a humidifier may help nasal congestion and ease sore throat pain. You can place a towel over your head and breathe in the steam from hot water coming from a faucet. Using a saline nasal spray works much the same way. Cough drops, hard candies and sore throat lozenges may ease your cough. Avoid close contacts especially the very young and the elderly Cover your mouth if you cough or sneeze Always remember to wash your hands.   GET HELP RIGHT AWAY IF: You develop worsening fever. If your symptoms do not improve within 10 days You develop yellow or green discharge from your nose over 3 days. You have coughing fits You develop a severe head ache or visual changes. You develop shortness of breath, difficulty breathing or start having chest pain Your symptoms persist after you have completed your treatment plan  MAKE SURE YOU  Understand these instructions. Will watch your condition. Will get help right away if you are not doing well or get worse.  Thank you for choosing an e-visit.  Your e-visit answers were reviewed by a board certified advanced clinical practitioner to complete your personal care plan. Depending upon the condition, your plan could have included both over the counter or prescription medications.  Please review your  pharmacy choice. Make sure the pharmacy is open so you can pick up prescription now. If there is a problem, you may contact your provider through [employer] and have the prescription routed to another pharmacy.  Your safety is important to [place]. If you have drug allergies check your prescription carefully.   For the next 24 hours you can use MyChart to ask questions about today's visit, request a non-urgent call back, or ask for a work or school excuse. You will get an email in the next two days asking about your experience. I hope that your e-visit has been valuable and will speed your recovery.     Approximately 5 minutes was used in reviewing the patient's chart, questionnaire, prescribing medications, and documentation.

## 2021-09-23 ENCOUNTER — Other Ambulatory Visit: Payer: Self-pay | Admitting: Family Medicine

## 2021-09-30 ENCOUNTER — Other Ambulatory Visit: Payer: Self-pay | Admitting: Family Medicine

## 2021-09-30 DIAGNOSIS — I10 Essential (primary) hypertension: Secondary | ICD-10-CM

## 2021-12-20 DIAGNOSIS — M25512 Pain in left shoulder: Secondary | ICD-10-CM | POA: Diagnosis not present

## 2021-12-24 ENCOUNTER — Other Ambulatory Visit: Payer: Self-pay | Admitting: Family Medicine

## 2021-12-24 DIAGNOSIS — G250 Essential tremor: Secondary | ICD-10-CM

## 2021-12-24 NOTE — Telephone Encounter (Signed)
Requested Prescriptions  Pending Prescriptions Disp Refills   propranolol ER (INDERAL LA) 80 MG 24 hr capsule [Pharmacy Med Name: PROPRANOLOL ER 80MG  CAPSULES] 24 capsule 0    Sig: TAKE 1 CAPSULE(80 MG) BY MOUTH DAILY     Cardiovascular:  Beta Blockers Failed - 12/24/2021 12:35 PM      Failed - Valid encounter within last 6 months    Recent Outpatient Visits          11 months ago History of 2019 novel coronavirus disease (COVID-19)   [facility] Fisher, 2020, MD   1 year ago Annual physical exam   [facility], MD   1 year ago Essential (primary) hypertension   [facility], MD   2 years ago Need for influenza vaccination   [facility], MD   2 years ago Annual physical exam   [facility], MD      Future Appointments            In 3 weeks Malva Limes, Sherrie Mustache, MD [facility]           Passed - Last BP in normal range    BP Readings from Last 1 Encounters:  08/30/20 129/80         Passed - Last Heart Rate in normal range    Pulse Readings from Last 1 Encounters:  08/30/20 (!) 57

## 2022-01-06 DIAGNOSIS — M25512 Pain in left shoulder: Secondary | ICD-10-CM | POA: Diagnosis not present

## 2022-01-16 ENCOUNTER — Encounter: Payer: Self-pay | Admitting: Family Medicine

## 2022-01-16 ENCOUNTER — Other Ambulatory Visit: Payer: Self-pay

## 2022-01-16 ENCOUNTER — Ambulatory Visit (INDEPENDENT_AMBULATORY_CARE_PROVIDER_SITE_OTHER): Payer: BC Managed Care – PPO | Admitting: Family Medicine

## 2022-01-16 VITALS — BP 128/74 | HR 56 | Temp 97.7°F | Resp 16 | Ht 73.0 in | Wt 222.0 lb

## 2022-01-16 DIAGNOSIS — S46212A Strain of muscle, fascia and tendon of other parts of biceps, left arm, initial encounter: Secondary | ICD-10-CM

## 2022-01-16 DIAGNOSIS — Z125 Encounter for screening for malignant neoplasm of prostate: Secondary | ICD-10-CM

## 2022-01-16 DIAGNOSIS — R739 Hyperglycemia, unspecified: Secondary | ICD-10-CM

## 2022-01-16 DIAGNOSIS — Z0000 Encounter for general adult medical examination without abnormal findings: Secondary | ICD-10-CM | POA: Diagnosis not present

## 2022-01-16 DIAGNOSIS — K219 Gastro-esophageal reflux disease without esophagitis: Secondary | ICD-10-CM

## 2022-01-16 DIAGNOSIS — I10 Essential (primary) hypertension: Secondary | ICD-10-CM

## 2022-01-16 DIAGNOSIS — G250 Essential tremor: Secondary | ICD-10-CM

## 2022-01-16 MED ORDER — PROPRANOLOL HCL ER 80 MG PO CP24: ORAL_CAPSULE | ORAL | 4 refills | Status: DC

## 2022-01-16 NOTE — Patient Instructions (Signed)
Please review the attached list of medications and notify my office if there are any errors.   Last recorded height was 6\' 1" . Weight goal for a BMI of 27 is 204.7 pounds.

## 2022-01-16 NOTE — Progress Notes (Signed)
I,Roshena L Chambers,acting as a scribe for Mila Merry, MD.,have documented all relevant documentation on the behalf of Mila Merry, MD,as directed by  Mila Merry, MD while in the presence of Mila Merry, MD.    Complete physical exam   Patient: Ryan Mcmahon   DOB: 08-Jul-1961   61 y.o. Male  MRN: [id number] Visit Date: 01/16/2022  Today's healthcare provider: Mila Merry, MD   Chief Complaint  Patient presents with   Annual Exam   Hypertension   Hyperglycemia   Anxiety   Subjective    Ryan Mcmahon is a 61 y.o. male who presents today for a complete physical exam.  He reports consuming a general diet. The patient does not participate in regular exercise at present. He generally feels fairly well. He reports sleeping fairly well. He does not have additional problems to discuss today.  HPI  Hypertension, follow-up  BP Readings from Last 3 Encounters:  01/16/22 128/74  08/30/20 129/80  05/19/20 (!) 144/80   Wt Readings from Last 3 Encounters:  01/16/22 222 lb (100.7 kg)  08/30/20 228 lb 6.4 oz (103.6 kg)  05/19/20 230 lb 6.4 oz (104.5 kg)     He was last seen for hypertension on 08/30/2020.   BP at that visit was 129/80. Management since that visit includes continue same medication.  He reports good compliance with treatment. He is not having side effects.  He is following a Regular diet. He is not exercising. He does not smoke.  Use of agents associated with hypertension: none.   Outside blood pressures are checked occasionally. Symptoms: No chest pain No chest pressure  No palpitations No syncope  No dyspnea No orthopnea  No paroxysmal nocturnal dyspnea No lower extremity edema   Pertinent labs: Lab Results  Component Value Date   CHOL 177 08/31/2020   HDL 36 (L) 08/31/2020   LDLCALC 119 (H) 08/31/2020   TRIG 118 08/31/2020   CHOLHDL 4.9 08/31/2020   Lab Results  Component Value Date   NA 138 08/31/2020   K 4.5 08/31/2020   CREATININE  1.12 08/31/2020   GFRNONAA 71 08/31/2020   GLUCOSE 117 (H) 08/31/2020   TSH 2.050 08/31/2020     The 10-year ASCVD risk score (Arnett DK, et al., 2019) is: 13.2%   ---------------------------------------------------------------------------------------------------   Anxiety, Follow-up  He was last seen for anxiety on 08/30/2020.   Changes made at last visit include none.   He reports good compliance with treatment. He reports good tolerance of treatment. He is not having side effects.   He feels his anxiety is moderate and Unchanged since last visit.  Symptoms: No chest pain No difficulty concentrating  No dizziness No fatigue  No feelings of losing control No insomnia  No irritable No palpitations  No panic attacks No racing thoughts  No shortness of breath No sweating  No tremors/shakes    GAD-7 Results No flowsheet data found.  PHQ-9 Scores PHQ9 SCORE ONLY 01/16/2022 08/30/2020 07/08/2019  PHQ-9 Total Score 2 0 1    ---------------------------------------------------------------------------------------------------   Hyperglycemia, Follow-up  Lab Results  Component Value Date   HGBA1C 5.6 03/14/2018   HGBA1C 5.7 (H) 02/21/2017   HGBA1C 5.7 (H) 02/11/2016   GLUCOSE 117 (H) 08/31/2020   GLUCOSE 129 (H) 07/14/2019   GLUCOSE 97 03/14/2018    Last seen for for this on 08/30/2020.  Management since that visit includes continuing same medication. Current symptoms include none and have been stable.  Prior visit with dietician:  no Current diet: well balanced Current exercise: none  Pertinent Labs:    Component Value Date/Time   CHOL 177 08/31/2020 0847   TRIG 118 08/31/2020 0847   CHOLHDL 4.9 08/31/2020 0847   CREATININE 1.12 08/31/2020 0847    Wt Readings from Last 3 Encounters:  01/16/22 222 lb (100.7 kg)  08/30/20 228 lb 6.4 oz (103.6 kg)  05/19/20 230 lb 6.4 oz (104.5 kg)     -----------------------------------------------------------------------------------------  Follow up for tremor:  The patient was last seen for this on 08/30/2020.  Changes made at last visit include none.  He reports good compliance with treatment. Takes propranolol every day and tolerating well.  He feels that condition is Unchanged. He is not having side effects.   -----------------------------------------------------------------------------------------    No past medical history on file. Past Surgical History:  Procedure Laterality Date   CHOLECYSTECTOMY  01/26/2015   [facility]; Dr. Alvino Chapel   GALLBLADDER SURGERY  3/16   TONSILLECTOMY AND ADENOIDECTOMY  1960's   Social History   Socioeconomic History   Marital status: Married    Spouse name: Not on file   Number of children: 2   Years of education: Not on file   Highest education level: Not on file  Occupational History   Not on file  Tobacco Use   Smoking status: Never   Smokeless tobacco: Current    Types: Chew  Vaping Use   Vaping Use: Never used  Substance and Sexual Activity   Alcohol use: Yes    Alcohol/week: 0.0 standard drinks    Comment: occasional use   Drug use: No   Sexual activity: Not on file  Other Topics Concern   Not on file  Social History Narrative   Not on file   Social Determinants of Health   Financial Resource Strain: Not on file  Food Insecurity: Not on file  Transportation Needs: Not on file  Physical Activity: Not on file  Stress: Not on file  Social Connections: Not on file  Intimate Partner Violence: Not on file   Family Status  Relation Name Status   Mother  Alive   Father  Deceased at age 53's       Died from [place]   Other grandfather Deceased   Family History  Problem Relation Age of Onset   Huntington's disease Father    Heart attack Other    Allergies  Allergen Reactions   Effexor Xr  [Venlafaxine]    Tarka   [Trandolapril-Verapamil Hcl Er]     Other reaction(s): Constipation, Nausea    Patient Care Team: Birdie Sons, MD as PCP - General (Family Medicine) Ortho, [facility]   Medications: Outpatient Medications Prior to Visit  Medication Sig   ALPRAZolam (XANAX) 0.25 MG tablet TAKE 1/2 TO 1 TABLET(0.125 TO 0.25 MG) BY MOUTH EVERY 8 HOURS AS NEEDED   lisinopril-hydrochlorothiazide (ZESTORETIC) 20-25 MG tablet TAKE 1 TABLET BY MOUTH DAILY   omeprazole (PRILOSEC OTC) 20 MG tablet Take 1 tablet by mouth daily.   propranolol ER (INDERAL LA) 80 MG 24 hr capsule TAKE 1 CAPSULE(80 MG) BY MOUTH DAILY   [DISCONTINUED] benzonatate (TESSALON) 100 MG capsule Take 1 capsule (100 mg total) by mouth 2 (two) times daily as needed for cough.   [DISCONTINUED] buPROPion (WELLBUTRIN SR) 150 MG 12 hr tablet Take 1 tablet (150 mg total) by mouth daily. Stop nicotine 14 days after starting medication   [DISCONTINUED] fluticasone (FLONASE) 50 MCG/ACT nasal spray Place 2  sprays into both nostrils daily.   No facility-administered medications prior to visit.    Review of Systems  Constitutional:  Negative for appetite change, chills, fatigue and fever.  HENT:  Negative for congestion, ear pain, hearing loss, nosebleeds and trouble swallowing.   Eyes:  Negative for pain and visual disturbance.  Respiratory:  Negative for cough, chest tightness, shortness of breath and wheezing.   Cardiovascular:  Negative for chest pain, palpitations and leg swelling.  Gastrointestinal:  Negative for abdominal pain, blood in stool, constipation, diarrhea, nausea and vomiting.  Endocrine: Negative for polydipsia, polyphagia and polyuria.  Genitourinary:  Negative for dysuria and flank pain.  Musculoskeletal:  Positive for arthralgias and myalgias. Negative for back pain, joint swelling and neck stiffness.  Skin:  Negative for color change, rash and wound.  Neurological:  Positive for tremors. Negative for dizziness, seizures, speech  difficulty, weakness, light-headedness and headaches.  Psychiatric/Behavioral:  Negative for behavioral problems, confusion, decreased concentration, dysphoric mood and sleep disturbance. The patient is nervous/anxious.   All other systems reviewed and are negative.    Objective    BP 128/74 (BP Location: Left Arm, Patient Position: Sitting, Cuff Size: Large)    Pulse (!) 56    Temp 97.7 F (36.5 C) (Oral)    Resp 16    Ht 6\' 1"  (1.854 m)    Wt 222 lb (100.7 kg)    SpO2 99% Comment: room air   BMI 29.29 kg/m     General Appearance:     Well developed, well nourished male. Alert, cooperative, in no acute distress, appears stated age  Head:    Normocephalic, without obvious abnormality, atraumatic  Eyes:    PERRL, conjunctiva/corneas clear, EOM's intact, fundi    benign, both eyes       Ears:    Normal TM's and external ear canals, both ears  Nose:   Nares normal, septum midline, mucosa normal, no drainage   or sinus tenderness  Throat:   Lips, mucosa, and tongue normal; teeth and gums normal  Neck:   Supple, symmetrical, trachea midline, no adenopathy;       thyroid:  No enlargement/tenderness/nodules; no carotid   bruit or JVD  Back:     Symmetric, no curvature, ROM normal, no CVA tenderness  Lungs:     Clear to auscultation bilaterally, respirations unlabored  Chest wall:    No tenderness or deformity  Heart:    Bradycardic. Normal rhythm. No murmurs, rubs, or gallops.  S1 and S2 normal  Abdomen:     Soft, non-tender, bowel sounds active all four quadrants,    no masses, no organomegaly  Genitalia:    deferred  Rectal:    deferred  Extremities:   All extremities are intact. No cyanosis or edema  Pulses:   2+ and symmetric all extremities  Skin:   Skin color, texture, turgor normal, no rashes or lesions  Lymph nodes:   Cervical, supraclavicular, and axillary nodes normal  Neurologic:   CNII-XII intact. Normal strength, sensation and reflexes      throughout     Last  depression screening scores PHQ 2/9 Scores 01/16/2022 08/30/2020 07/08/2019  PHQ - 2 Score 0 0 0  PHQ- 9 Score 2 0 1   Last fall risk screening Fall Risk  01/16/2022  Falls in the past year? 0  Number falls in past yr: 0  Injury with Fall? 0  Risk for fall due to : No Fall Risks  Follow up Falls evaluation  completed   Last Audit-C alcohol use screening Alcohol Use Disorder Test (AUDIT) 08/30/2020  1. How often do you have a drink containing alcohol? 3  2. How many drinks containing alcohol do you have on a typical day when you are drinking? 0  3. How often do you have six or more drinks on one occasion? 1  AUDIT-C Score 4  4. How often during the last year have you found that you were not able to stop drinking once you had started? 0  5. How often during the last year have you failed to do what was normally expected from you because of drinking? 0  6. How often during the last year have you needed a first drink in the morning to get yourself going after a heavy drinking session? 0  7. How often during the last year have you had a feeling of guilt of remorse after drinking? 0  8. How often during the last year have you been unable to remember what happened the night before because you had been drinking? 0  9. Have you or someone else been injured as a result of your drinking? 0  10. Has a relative or friend or a doctor or another health worker been concerned about your drinking or suggested you cut down? 0  Alcohol Use Disorder Identification Test Final Score (AUDIT) 4   A score of 3 or more in women, and 4 or more in men indicates increased risk for alcohol abuse, EXCEPT if all of the points are from question 1   No results found for any visits on 01/16/22.  Assessment & Plan    Routine Health Maintenance and Physical Exam  Exercise Activities and Dietary recommendations  Goals   None     Immunization History  Administered Date(s) Administered   Influenza,inj,Quad PF,6+ Mos  09/14/2018, 09/08/2019, 08/30/2020   Influenza-Unspecified 10/05/2015, 09/12/2017, 09/30/2021   Td 12/04/2001   Tdap 07/20/2009, 07/08/2019   Zoster Recombinat (Shingrix) 07/08/2019, 09/08/2019    Health Maintenance  Topic Date Due   HIV Screening  Never done   COLONOSCOPY (Pts 45-64yrs Insurance coverage will need to be confirmed)  11/16/2027   TETANUS/TDAP  07/07/2029   INFLUENZA VACCINE  Completed   Hepatitis C Screening  Completed   Zoster Vaccines- Shingrix  Completed   HPV VACCINES  Aged Out    Discussed health benefits of physical activity, and encouraged him to engage in regular exercise appropriate for his age and condition.  1. Prostate cancer screening  - PSA Total (Reflex To Free) ([facility] only)  2. Strain of left biceps, initial encounter Injury occurred about 4 months ago and followed at [facility], apparently working with PT  3. Gastroesophageal reflux disease, unspecified whether esophagitis present Doing well with omeprazole.   4. Essential (primary) hypertension Well controlled.  Continue current medications.   - CBC - Lipid panel - Comprehensive metabolic panel  5. Hyperglycemia  - Hemoglobin A1c  6. Benign essential tremor Well controlled.  Continue current medications.   - propranolol ER (INDERAL LA) 80 MG 24 hr capsule; TAKE 1 CAPSULE(80 MG) BY MOUTH DAILY  Dispense: 90 capsule; Refill: 4      The entirety of the information documented in the History of Present Illness, Review of Systems and Physical Exam were personally obtained by me. Portions of this information were initially documented by the CMA and reviewed by me for thoroughness and accuracy.     Mila Merry, MD  [facility] [phone] (phone)  [phone] (fax)  [place]

## 2022-01-20 DIAGNOSIS — I10 Essential (primary) hypertension: Secondary | ICD-10-CM | POA: Diagnosis not present

## 2022-01-20 DIAGNOSIS — R739 Hyperglycemia, unspecified: Secondary | ICD-10-CM | POA: Diagnosis not present

## 2022-01-20 DIAGNOSIS — Z125 Encounter for screening for malignant neoplasm of prostate: Secondary | ICD-10-CM | POA: Diagnosis not present

## 2022-01-20 LAB — LIPID PANEL

## 2022-01-21 LAB — CBC
Hematocrit: 44.1 % (ref 37.5–51.0)
Hemoglobin: 15.7 g/dL (ref 13.0–17.7)
MCH: 32.2 pg (ref 26.6–33.0)
MCHC: 35.6 g/dL (ref 31.5–35.7)
MCV: 91 fL (ref 79–97)
Platelets: 232 10*3/uL (ref 150–450)
RBC: 4.87 x10E6/uL (ref 4.14–5.80)
RDW: 13 % (ref 11.6–15.4)
WBC: 10.9 10*3/uL — ABNORMAL HIGH (ref 3.4–10.8)

## 2022-01-21 LAB — COMPREHENSIVE METABOLIC PANEL
ALT: 15 IU/L (ref 0–44)
AST: 21 IU/L (ref 0–40)
Albumin/Globulin Ratio: 1.7 (ref 1.2–2.2)
Albumin: 4.7 g/dL (ref 3.8–4.8)
Alkaline Phosphatase: 88 IU/L (ref 44–121)
BUN/Creatinine Ratio: 14 (ref 10–24)
BUN: 17 mg/dL (ref 8–27)
Bilirubin Total: 1 mg/dL (ref 0.0–1.2)
CO2: 24 mmol/L (ref 20–29)
Calcium: 9.5 mg/dL (ref 8.6–10.2)
Chloride: 97 mmol/L (ref 96–106)
Creatinine, Ser: 1.23 mg/dL (ref 0.76–1.27)
Globulin, Total: 2.8 g/dL (ref 1.5–4.5)
Glucose: 112 mg/dL — ABNORMAL HIGH (ref 70–99)
Potassium: 4.6 mmol/L (ref 3.5–5.2)
Sodium: 137 mmol/L (ref 134–144)
Total Protein: 7.5 g/dL (ref 6.0–8.5)
eGFR: 66 mL/min/{1.73_m2} (ref >59)

## 2022-01-21 LAB — LIPID PANEL
Chol/HDL Ratio: 4.9 ratio (ref 0.0–5.0)
Cholesterol, Total: 183 mg/dL (ref 100–199)
HDL: 37 mg/dL — ABNORMAL LOW (ref >39)
LDL Chol Calc (NIH): 126 mg/dL — ABNORMAL HIGH (ref 0–99)
Triglycerides: 107 mg/dL (ref 0–149)
VLDL Cholesterol Cal: 20 mg/dL (ref 5–40)

## 2022-01-21 LAB — PSA TOTAL (REFLEX TO FREE): Prostate Specific Ag, Serum: 1.6 ng/mL (ref 0.0–4.0)

## 2022-01-21 LAB — HEMOGLOBIN A1C
Est. average glucose Bld gHb Est-mCnc: 120 mg/dL
Hgb A1c MFr Bld: 5.8 % — ABNORMAL HIGH (ref 4.8–5.6)

## 2022-01-25 DIAGNOSIS — M25512 Pain in left shoulder: Secondary | ICD-10-CM | POA: Diagnosis not present

## 2022-01-30 DIAGNOSIS — D2271 Melanocytic nevi of right lower limb, including hip: Secondary | ICD-10-CM | POA: Diagnosis not present

## 2022-01-30 DIAGNOSIS — L570 Actinic keratosis: Secondary | ICD-10-CM | POA: Diagnosis not present

## 2022-01-30 DIAGNOSIS — Z85828 Personal history of other malignant neoplasm of skin: Secondary | ICD-10-CM | POA: Diagnosis not present

## 2022-01-30 DIAGNOSIS — D2262 Melanocytic nevi of left upper limb, including shoulder: Secondary | ICD-10-CM | POA: Diagnosis not present

## 2022-01-30 DIAGNOSIS — D225 Melanocytic nevi of trunk: Secondary | ICD-10-CM | POA: Diagnosis not present

## 2022-01-30 DIAGNOSIS — D0462 Carcinoma in situ of skin of left upper limb, including shoulder: Secondary | ICD-10-CM | POA: Diagnosis not present

## 2022-01-30 DIAGNOSIS — D485 Neoplasm of uncertain behavior of skin: Secondary | ICD-10-CM | POA: Diagnosis not present

## 2022-01-31 DIAGNOSIS — M25512 Pain in left shoulder: Secondary | ICD-10-CM | POA: Diagnosis not present

## 2022-02-07 DIAGNOSIS — M25512 Pain in left shoulder: Secondary | ICD-10-CM | POA: Diagnosis not present

## 2022-02-14 DIAGNOSIS — M25512 Pain in left shoulder: Secondary | ICD-10-CM | POA: Diagnosis not present

## 2022-03-15 ENCOUNTER — Other Ambulatory Visit: Payer: Self-pay | Admitting: Family Medicine

## 2022-03-15 DIAGNOSIS — I10 Essential (primary) hypertension: Secondary | ICD-10-CM

## 2022-03-15 MED ORDER — LISINOPRIL-HYDROCHLOROTHIAZIDE 20-25 MG PO TABS: 1.0000 | ORAL_TABLET | Freq: Every day | ORAL | 4 refills | Status: DC

## 2022-03-21 DIAGNOSIS — D0462 Carcinoma in situ of skin of left upper limb, including shoulder: Secondary | ICD-10-CM | POA: Diagnosis not present

## 2022-04-01 ENCOUNTER — Other Ambulatory Visit: Payer: Self-pay | Admitting: Family Medicine

## 2022-07-25 ENCOUNTER — Ambulatory Visit
Admission: EM | Admit: 2022-07-25 | Discharge: 2022-07-25 | Disposition: A | Discharge status: Home / Self Care | Payer: BC Managed Care – PPO

## 2022-07-25 DIAGNOSIS — H6982 Other specified disorders of Eustachian tube, left ear: Secondary | ICD-10-CM | POA: Diagnosis not present

## 2022-07-25 DIAGNOSIS — H9202 Otalgia, left ear: Secondary | ICD-10-CM | POA: Diagnosis not present

## 2022-07-25 NOTE — ED Triage Notes (Signed)
Patient to Urgent Care with complaints of left sided ear pain x3 days, describes pain as pressure like and throbbing. Reports taking ibuprofen to help wit the discomfort. Denies any known fevers.

## 2022-07-25 NOTE — Discharge Instructions (Addendum)
Use the Flonase nasal spray as directed.  Take plain Mucinex and ibuprofen as directed.  Follow up with your primary care provider if your symptoms are not improving.

## 2022-07-25 NOTE — ED Provider Notes (Signed)
Ryan Mcmahon    CSN: [id number] Arrival date & time: 07/25/22  1608      History   Chief Complaint Chief Complaint  Patient presents with   Otalgia    HPI Ryan Mcmahon is a 60 y.o. male.  Patient presents with 3-day history of left ear pain.  He denies ear drainage, fever, chills, sore throat, cough, shortness of breath, vomiting, diarrhea, or other symptoms.  Treatment attempted with ibuprofen.  His medical history includes hypertension.  The history is provided by the patient and medical records.    History reviewed. No pertinent past medical history.  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 Brigid Re   GALLBLADDER SURGERY  3/16   TONSILLECTOMY AND ADENOIDECTOMY  1960's       Home Medications    Prior to Admission medications   Medication Sig Start Date End Date Taking? Authorizing Provider  ALPRAZolam (XANAX) 0.25 MG tablet TAKE 1/2 TO 1 TABLET(0.125 TO 0.25 MG) BY MOUTH EVERY 8 HOURS AS NEEDED 04/02/22   Malva Limes, MD  lisinopril-hydrochlorothiazide (ZESTORETIC) 20-25 MG tablet Take 1 tablet by mouth daily. 03/15/22   Malva Limes, MD  omeprazole (PRILOSEC OTC) 20 MG tablet Take 1 tablet by mouth daily. 10/14/08   [provider]  propranolol ER  (INDERAL LA) 80 MG 24 hr capsule TAKE 1 CAPSULE(80 MG) BY MOUTH DAILY 01/16/22   Malva Limes, MD    Family History Family History  Problem Relation Age of Onset   Huntington's disease Father    Heart attack Other     Social History Social History   Tobacco Use   Smoking status: Never   Smokeless tobacco: Current    Types: Chew  Vaping Use   Vaping Use: Never used  Substance Use Topics   Alcohol use: Yes    Alcohol/week: 0.0 standard drinks of alcohol    Comment: occasional use   Drug use: No     Allergies   Effexor xr  [venlafaxine] and Tarka  [trandolapril-verapamil hcl er]   Review of Systems Review of Systems  Constitutional:  Negative for chills and fever.  HENT:  Positive for ear pain. Negative for congestion, ear discharge and sore throat.   Respiratory:  Negative for cough and shortness of breath.   Gastrointestinal:  Negative for diarrhea and vomiting.  Skin:  Negative for color change and rash.  All other systems reviewed and are negative.    Physical Exam Triage Vital Signs ED Triage Vitals  Enc Vitals Group     BP      Pulse      Resp      Temp      Temp src      SpO2      Weight      Height  Head Circumference      Peak Flow      Pain Score      Pain Loc      Pain Edu?      Excl. in GC?    No data found.  Updated Vital Signs BP 138/79   Pulse 72   Temp 98 F (36.7 C)   Resp 17   Ht 6\' 1"  (1.854 m)   SpO2 99%   BMI 29.29 kg/m   Visual Acuity Right Eye Distance:   Left Eye Distance:   Bilateral Distance:    Right Eye Near:   Left Eye Near:    Bilateral Near:     Physical Exam Vitals and nursing note reviewed.  Constitutional:      General: He is not in acute distress.    Appearance: Normal appearance. He is well-developed. He is not ill-appearing.  HENT:     Right Ear: Tympanic membrane and ear canal normal. There is no impacted cerumen.     Left Ear: Tympanic membrane and ear canal normal. There is no  impacted cerumen.     Nose: Nose normal.     Mouth/Throat:     Mouth: Mucous membranes are moist.     Pharynx: Oropharynx is clear.  Neck:     Comments: Enlarged left submandibular lymph node.  Cardiovascular:     Rate and Rhythm: Normal rate and regular rhythm.     Heart sounds: Normal heart sounds.  Pulmonary:     Effort: Pulmonary effort is normal. No respiratory distress.     Breath sounds: Normal breath sounds.  Musculoskeletal:     Cervical back: Neck supple.  Lymphadenopathy:     Cervical: Cervical adenopathy present.  Skin:    General: Skin is warm and dry.  Neurological:     Mental Status: He is alert.  Psychiatric:        Mood and Affect: Mood normal.        Behavior: Behavior normal.      UC Treatments / Results  Labs (all labs ordered are listed, but only abnormal results are displayed) Labs Reviewed - No data to display  EKG   Radiology No results found.  Procedures Procedures (including critical care time)  Medications Ordered in UC Medications - No data to display  Initial Impression / Assessment and Plan / UC Course  I have reviewed the triage vital signs and the nursing notes.  Pertinent labs & imaging results that were available during my care of the patient were reviewed by me and considered in my medical decision making (see chart for details).   Left otalgia, left eustachian tube dysfunction.  No indication of infection at this time.  Discussed symptomatic treatment including ibuprofen, plain Mucinex, Flonase nasal spray.  Instructed patient to follow-up with his PCP if his symptoms are not improving.  He agrees to plan of care.   Final Clinical Impressions(s) / UC Diagnoses   Final diagnoses:  Acute otalgia, left  Dysfunction of left eustachian tube     Discharge Instructions      Use the Flonase nasal spray as directed.  Take plain Mucinex and ibuprofen as directed.  Follow up with your primary care provider if your symptoms are  not improving.        ED Prescriptions   None    PDMP not reviewed this encounter.   , NP 07/25/22 [phone]

## 2022-07-31 DIAGNOSIS — D2262 Melanocytic nevi of left upper limb, including shoulder: Secondary | ICD-10-CM | POA: Diagnosis not present

## 2022-07-31 DIAGNOSIS — D485 Neoplasm of uncertain behavior of skin: Secondary | ICD-10-CM | POA: Diagnosis not present

## 2022-07-31 DIAGNOSIS — L570 Actinic keratosis: Secondary | ICD-10-CM | POA: Diagnosis not present

## 2022-07-31 DIAGNOSIS — D2261 Melanocytic nevi of right upper limb, including shoulder: Secondary | ICD-10-CM | POA: Diagnosis not present

## 2022-07-31 DIAGNOSIS — Z85828 Personal history of other malignant neoplasm of skin: Secondary | ICD-10-CM | POA: Diagnosis not present

## 2022-07-31 DIAGNOSIS — D2272 Melanocytic nevi of left lower limb, including hip: Secondary | ICD-10-CM | POA: Diagnosis not present

## 2022-07-31 DIAGNOSIS — D0421 Carcinoma in situ of skin of right ear and external auricular canal: Secondary | ICD-10-CM | POA: Diagnosis not present

## 2022-07-31 DIAGNOSIS — D0359 Melanoma in situ of other part of trunk: Secondary | ICD-10-CM | POA: Diagnosis not present

## 2022-08-10 DIAGNOSIS — D0359 Melanoma in situ of other part of trunk: Secondary | ICD-10-CM | POA: Diagnosis not present

## 2022-09-25 DIAGNOSIS — D0421 Carcinoma in situ of skin of right ear and external auricular canal: Secondary | ICD-10-CM | POA: Diagnosis not present

## 2022-11-17 ENCOUNTER — Other Ambulatory Visit: Payer: Self-pay | Admitting: Family Medicine

## 2022-11-17 NOTE — Telephone Encounter (Signed)
Requested medication (s) are due for refill today:   Provider to review  Requested medication (s) are on the active medication list:   Yes  Future visit scheduled:   Yes   Last ordered: 04/02/2022 #30, 3 refills  Non delegated refill   Requested Prescriptions  Pending Prescriptions Disp Refills   ALPRAZolam (XANAX) 0.25 MG tablet [Pharmacy Med Name: ALPRAZOLAM 0.25MG  TABLETS] 30 tablet     Sig: TAKE 1/2 TO 1 TABLET(0.125 TO 0.25 MG) BY MOUTH EVERY 8 HOURS AS NEEDED     Not Delegated - Psychiatry: Anxiolytics/Hypnotics 2 Failed - 11/17/2022  2:41 PM      Failed - This refill cannot be delegated      Failed - Urine Drug Screen completed in last 360 days      Failed - Valid encounter within last 6 months    Recent Outpatient Visits           10 months ago Annual physical exam   [facility] Malva Limes, MD   1 year ago History of 2019 novel coronavirus disease (COVID-19)   [facility] Fisher, Demetrios Isaacs, MD   2 years ago Annual physical exam   [facility] Malva Limes, MD   2 years ago Essential (primary) hypertension   [facility] Malva Limes, MD   3 years ago Need for influenza vaccination   [facility] Malva Limes, MD       Future Appointments             In 2 months Fisher, Demetrios Isaacs, MD [facility]            Passed - Patient is not pregnant

## 2023-01-15 ENCOUNTER — Encounter: Payer: Self-pay | Admitting: Family Medicine

## 2023-01-16 DIAGNOSIS — L570 Actinic keratosis: Secondary | ICD-10-CM | POA: Diagnosis not present

## 2023-01-16 DIAGNOSIS — D225 Melanocytic nevi of trunk: Secondary | ICD-10-CM | POA: Diagnosis not present

## 2023-01-16 DIAGNOSIS — Z85828 Personal history of other malignant neoplasm of skin: Secondary | ICD-10-CM | POA: Diagnosis not present

## 2023-01-16 DIAGNOSIS — D2261 Melanocytic nevi of right upper limb, including shoulder: Secondary | ICD-10-CM | POA: Diagnosis not present

## 2023-01-16 DIAGNOSIS — Z85820 Personal history of malignant melanoma of skin: Secondary | ICD-10-CM | POA: Diagnosis not present

## 2023-01-19 ENCOUNTER — Encounter: Payer: BC Managed Care – PPO | Admitting: Family Medicine

## 2023-02-05 ENCOUNTER — Other Ambulatory Visit: Payer: Self-pay | Admitting: Family Medicine

## 2023-02-05 DIAGNOSIS — G250 Essential tremor: Secondary | ICD-10-CM

## 2023-03-12 ENCOUNTER — Encounter: Payer: BC Managed Care – PPO | Admitting: Family Medicine

## 2023-03-19 ENCOUNTER — Ambulatory Visit (INDEPENDENT_AMBULATORY_CARE_PROVIDER_SITE_OTHER): Payer: BC Managed Care – PPO | Admitting: Family Medicine

## 2023-03-19 ENCOUNTER — Encounter: Payer: Self-pay | Admitting: Family Medicine

## 2023-03-19 VITALS — BP 137/80 | HR 54 | Ht 73.0 in | Wt 228.0 lb

## 2023-03-19 DIAGNOSIS — N529 Male erectile dysfunction, unspecified: Secondary | ICD-10-CM

## 2023-03-19 DIAGNOSIS — K219 Gastro-esophageal reflux disease without esophagitis: Secondary | ICD-10-CM

## 2023-03-19 DIAGNOSIS — Z125 Encounter for screening for malignant neoplasm of prostate: Secondary | ICD-10-CM

## 2023-03-19 DIAGNOSIS — Z0000 Encounter for general adult medical examination without abnormal findings: Secondary | ICD-10-CM | POA: Diagnosis not present

## 2023-03-19 DIAGNOSIS — I10 Essential (primary) hypertension: Secondary | ICD-10-CM

## 2023-03-19 DIAGNOSIS — E291 Testicular hypofunction: Secondary | ICD-10-CM | POA: Diagnosis not present

## 2023-03-19 DIAGNOSIS — N401 Enlarged prostate with lower urinary tract symptoms: Secondary | ICD-10-CM

## 2023-03-19 DIAGNOSIS — G250 Essential tremor: Secondary | ICD-10-CM | POA: Diagnosis not present

## 2023-03-19 DIAGNOSIS — R5383 Other fatigue: Secondary | ICD-10-CM

## 2023-03-19 DIAGNOSIS — R351 Nocturia: Secondary | ICD-10-CM

## 2023-03-19 MED ORDER — TADALAFIL 5 MG PO TABS: 5.0000 mg | ORAL_TABLET | Freq: Every day | ORAL | 11 refills | Status: DC

## 2023-03-20 NOTE — Progress Notes (Signed)
Complete physical exam   Patient: Ryan Mcmahon   DOB: 03/17/1961   62 y.o. Male  MRN: [id number] Visit Date: 03/19/2023  Today's healthcare provider: Mila Merry, MD   Chief Complaint  Patient presents with   Hypertension   Gastroesophageal Reflux   Annual Exam   Subjective    YOUCEF Mcmahon is a 62 y.o. male who presents today for a complete physical exam.  He reports consuming a  regular  diet.  He generally feels fairly well. He reports sleeping well. He does have additional problems to discuss today. He does have some trouble with fatigue on work days. He goes to work very early in the morning, but does not feel sleepy once he gets going. He does have history of mildly low testosterone, but elected not to take testosterone supplements due to potential side effects. He also has history of urinary hesitancy attributed to BPH and ED. Urinary hesitancy is intermittent.   He continues on lisinopril-hctz for hypertension and propranolol for tremors. He rarely takes alprazolam. He takes omeprazole occasionally and only as needed for heartburn which occurs less than 3 times per week.    Past Medical History:  Diagnosis Date   History of 2019 novel coronavirus disease (COVID-19) 01/14/2021   Past Surgical History:  Procedure Laterality Date   CHOLECYSTECTOMY  01/26/2015   [facility]; Dr. Brigid Re   GALLBLADDER SURGERY  3/16   TONSILLECTOMY AND ADENOIDECTOMY  1960's   Social History   Socioeconomic History   Marital status: Married    Spouse name: Not on file   Number of children: 2   Years of education: Not on file   Highest education level: Not on file  Occupational History   Not on file  Tobacco Use   Smoking status: Never   Smokeless tobacco: Current    Types: Chew  Vaping Use   Vaping Use: Never used  Substance and Sexual Activity   Alcohol use: Yes    Alcohol/week: 0.0 standard drinks of alcohol    Comment: occasional use   Drug use: No   Sexual activity: Not  on file  Other Topics Concern   Not on file  Social History Narrative   Not on file   Social Determinants of Health   Financial Resource Strain: Not on file  Food Insecurity: Not on file  Transportation Needs: Not on file  Physical Activity: Not on file  Stress: Not on file  Social Connections: Not on file  Intimate Partner Violence: Not on file   Family Status  Relation Name Status   Mother  Alive   Father  Deceased at age 58's       Died from Huntingtons Disease   Sister  Alive   Other grandfather Deceased   Family History  Problem Relation Age of Onset   Huntington's disease Father    Heart attack Other    Allergies  Allergen Reactions   Effexor Xr  [Venlafaxine]    Tarka  [Trandolapril-Verapamil Hcl Er]     Other reaction(s): Constipation, Nausea    Patient Care Team: Malva Limes, MD as PCP - General (Family Medicine) Ortho, [facility]   Medications: Outpatient Medications Prior to Visit  Medication Sig   ALPRAZolam (XANAX) 0.25 MG tablet TAKE 1/2 TO 1 TABLET(0.125 TO 0.25 MG) BY MOUTH EVERY 8 HOURS AS NEEDED   lisinopril-hydrochlorothiazide (ZESTORETIC) 20-25 MG tablet Take 1 tablet by mouth daily.   omeprazole (PRILOSEC OTC) 20 MG tablet Take 1  tablet by mouth daily.   propranolol ER (INDERAL LA) 80 MG 24 hr capsule TAKE 1 CAPSULE(80 MG) BY MOUTH DAILY   No facility-administered medications prior to visit.    Review of Systems  Constitutional:  Positive for fatigue. Negative for chills, diaphoresis and fever.  HENT:  Negative for congestion, ear discharge, ear pain, hearing loss, nosebleeds, sore throat and tinnitus.   Eyes:  Negative for photophobia, pain, discharge and redness.  Respiratory:  Negative for cough, shortness of breath, wheezing and stridor.   Cardiovascular:  Negative for chest pain, palpitations and leg swelling.  Gastrointestinal:  Negative for abdominal pain, blood in stool, constipation, diarrhea, nausea and vomiting.  Endocrine:  Negative for polydipsia.  Genitourinary:  Positive for frequency and urgency. Negative for dysuria, flank pain and hematuria.  Musculoskeletal:  Negative for back pain, myalgias and neck pain.  Skin:  Negative for rash.  Allergic/Immunologic: Negative for environmental allergies.  Neurological:  Negative for dizziness, tremors, seizures, weakness and headaches.  Hematological:  Does not bruise/bleed easily.  Psychiatric/Behavioral:  Negative for hallucinations and suicidal ideas. The patient is not nervous/anxious.       Objective    BP 137/80 (BP Location: Right Arm, Patient Position: Sitting, Cuff Size: Large)   Pulse (!) 54   Ht  (1.854 m)   Wt 228 lb (103.4 kg)   SpO2 97%   BMI 30.08 kg/m    Physical Exam   General Appearance:    Overweight male. Alert, cooperative, in no acute distress, appears stated age  Head:    Normocephalic, without obvious abnormality, atraumatic  Eyes:    PERRL, conjunctiva/corneas clear, EOM's intact, fundi    benign, both eyes       Ears:    Normal TM's and external ear canals, both ears  Nose:   Nares normal, septum midline, mucosa normal, no drainage   or sinus tenderness  Throat:   Lips, mucosa, and tongue normal; teeth and gums normal  Neck:   Supple, symmetrical, trachea midline, no adenopathy;       thyroid:  No enlargement/tenderness/nodules; no carotid   bruit or JVD  Back:     Symmetric, no curvature, ROM normal, no CVA tenderness  Lungs:     Clear to auscultation bilaterally, respirations unlabored  Chest wall:    No tenderness or deformity  Heart:    Bradycardic. Normal rhythm. No murmurs, rubs, or gallops.  S1 and S2 normal  Abdomen:     Soft, non-tender, bowel sounds active all four quadrants,    no masses, no organomegaly  Genitalia:    deferred  Rectal:    deferred  Extremities:   All extremities are intact. No cyanosis or edema  Pulses:   2+ and symmetric all extremities  Skin:   Skin color, texture, turgor normal, no  rashes or lesions  Lymph nodes:   Cervical, supraclavicular, and axillary nodes normal  Neurologic:   CNII-XII intact. Normal strength, sensation and reflexes      throughout     Last depression screening scores    03/19/2023    4:11 PM 01/16/2022    2:08 PM 08/30/2020    2:26 PM  PHQ 2/9 Scores  PHQ - 2 Score 0 0 0  PHQ- 9 Score 1 2 0   Last fall risk screening    03/19/2023    4:11 PM  Fall Risk   Falls in the past year? 0  Number falls in past yr: 0  Injury with  Fall? 0   Last Audit-C alcohol use screening    03/19/2023    4:11 PM  Alcohol Use Disorder Test (AUDIT)  1. How often do you have a drink containing alcohol? 0  3. How often do you have six or more drinks on one occasion? 0   A score of 3 or more in women, and 4 or more in men indicates increased risk for alcohol abuse, EXCEPT if all of the points are from question 1   No results found for any visits on 03/19/23.  Assessment & Plan    Routine Health Maintenance and Physical Exam  Exercise Activities and Dietary recommendations  Goals   None     Immunization History  Administered Date(s) Administered   Influenza,inj,Quad PF,6+ Mos 09/14/2018, 09/08/2019, 08/30/2020   Influenza-Unspecified 10/05/2015, 09/12/2017, 09/30/2021   Td 12/04/2001   Tdap 07/20/2009, 07/08/2019   Zoster Recombinat (Shingrix) 07/08/2019, 09/08/2019    Health Maintenance  Topic Date Due   COVID-19 Vaccine (1) Never done   HIV Screening  Never done   INFLUENZA VACCINE  07/05/2023   COLONOSCOPY (Pts 45-3yrs Insurance coverage will need to be confirmed)  11/16/2027   DTaP/Tdap/Td (4 - Td or Tdap) 07/07/2029   Hepatitis C Screening  Completed   Zoster Vaccines- Shingrix  Completed   HPV VACCINES  Aged Out    Discussed health benefits of physical activity, and encouraged him to engage in regular exercise appropriate for his age and condition.   2. Prostate cancer screening  - PSA Total (Reflex To Free) ([facility]  only)  3. Essential (primary) hypertension Well controlled.  Continue current medications.   - CBC - Comprehensive metabolic panel - Lipid panel - TSH - EKG 12-Lead  4. Benign essential tremor Fairly well controlled on propranolol  5. Gastroesophageal reflux disease, unspecified whether esophagitis present Controlled with dietary discretion on prn omeprazole.   6. Hypogonadism male Has elected not to replace testosterone due to potential side effects.   7. Other fatigue Labs as above. Likely due to aging and very early work days.   8. ED (erectile dysfunction) of organic origin   9. BPH associated with nocturia  - tadalafil (CIALIS) 5 MG tablet; Take 1 tablet (5 mg total) by mouth daily.  Dispense: 30 tablet; Refill: 11     The entirety of the information documented in the History of Present Illness, Review of Systems and Physical Exam were personally obtained by me. Portions of this information were initially documented by the CMA and reviewed by me for thoroughness and accuracy.     Mila Merry, MD  [facility] [phone] (phone) [phone] (fax)  [facility]

## 2023-03-22 DIAGNOSIS — I10 Essential (primary) hypertension: Secondary | ICD-10-CM | POA: Diagnosis not present

## 2023-03-22 DIAGNOSIS — Z125 Encounter for screening for malignant neoplasm of prostate: Secondary | ICD-10-CM | POA: Diagnosis not present

## 2023-03-23 LAB — TSH: TSH: 2.7 u[IU]/mL (ref 0.450–4.500)

## 2023-03-23 LAB — CBC
Hematocrit: 44.9 % (ref 37.5–51.0)
Hemoglobin: 15.2 g/dL (ref 13.0–17.7)
MCH: 32.3 pg (ref 26.6–33.0)
MCHC: 33.9 g/dL (ref 31.5–35.7)
MCV: 96 fL (ref 79–97)
Platelets: 219 10*3/uL (ref 150–450)
RBC: 4.7 x10E6/uL (ref 4.14–5.80)
RDW: 12.8 % (ref 11.6–15.4)
WBC: 10.4 10*3/uL (ref 3.4–10.8)

## 2023-03-23 LAB — COMPREHENSIVE METABOLIC PANEL
ALT: 15 IU/L (ref 0–44)
AST: 19 IU/L (ref 0–40)
Albumin/Globulin Ratio: 1.5 (ref 1.2–2.2)
Albumin: 4 g/dL (ref 3.9–4.9)
Alkaline Phosphatase: 89 IU/L (ref 44–121)
BUN/Creatinine Ratio: 13 (ref 10–24)
BUN: 17 mg/dL (ref 8–27)
Bilirubin Total: 0.6 mg/dL (ref 0.0–1.2)
CO2: 24 mmol/L (ref 20–29)
Calcium: 9.3 mg/dL (ref 8.6–10.2)
Chloride: 100 mmol/L (ref 96–106)
Creatinine, Ser: 1.26 mg/dL (ref 0.76–1.27)
Globulin, Total: 2.6 g/dL (ref 1.5–4.5)
Glucose: 115 mg/dL — ABNORMAL HIGH (ref 70–99)
Potassium: 4.7 mmol/L (ref 3.5–5.2)
Sodium: 137 mmol/L (ref 134–144)
Total Protein: 6.6 g/dL (ref 6.0–8.5)
eGFR: 64 mL/min/{1.73_m2} (ref >59)

## 2023-03-23 LAB — LIPID PANEL
Chol/HDL Ratio: 4.5 ratio (ref 0.0–5.0)
Cholesterol, Total: 177 mg/dL (ref 100–199)
HDL: 39 mg/dL — ABNORMAL LOW (ref >39)
LDL Chol Calc (NIH): 117 mg/dL — ABNORMAL HIGH (ref 0–99)
Triglycerides: 115 mg/dL (ref 0–149)
VLDL Cholesterol Cal: 21 mg/dL (ref 5–40)

## 2023-03-23 LAB — PSA TOTAL (REFLEX TO FREE): Prostate Specific Ag, Serum: 1.9 ng/mL (ref 0.0–4.0)

## 2023-04-01 ENCOUNTER — Other Ambulatory Visit: Payer: Self-pay | Admitting: Family Medicine

## 2023-04-01 DIAGNOSIS — I10 Essential (primary) hypertension: Secondary | ICD-10-CM

## 2023-04-05 ENCOUNTER — Other Ambulatory Visit: Payer: Self-pay | Admitting: Family Medicine

## 2023-04-05 DIAGNOSIS — G250 Essential tremor: Secondary | ICD-10-CM

## 2023-04-08 ENCOUNTER — Ambulatory Visit
Admission: EM | Admit: 2023-04-08 | Discharge: 2023-04-08 | Disposition: A | Discharge status: Home / Self Care | Payer: BC Managed Care – PPO | Attending: Nurse Practitioner | Admitting: Nurse Practitioner

## 2023-04-08 DIAGNOSIS — J069 Acute upper respiratory infection, unspecified: Secondary | ICD-10-CM

## 2023-04-08 LAB — POCT RAPID STREP A (OFFICE): Rapid Strep A Screen: NEGATIVE

## 2023-04-08 MED ORDER — PROMETHAZINE-DM 6.25-15 MG/5ML PO SYRP
5.0000 mL | ORAL_SOLUTION | Freq: Four times a day (QID) | ORAL | 0 refills | Status: DC | PRN
Start: 2023-04-08 — End: 2024-03-28

## 2023-04-08 NOTE — Discharge Instructions (Signed)
Your strep test was negative. Please treat your symptoms with over the counter tylenol or ibuprofen, humidifier, and rest.  Promethazine DM as needed for cough.  Please note this medication will make you drowsy.  Viral illnesses can last 7-14 days. Please follow up with your PCP if your symptoms are not improving. Please go to the ER for any worsening symptoms. This includes but is not limited to fever you can not control with tylenol or ibuprofen, you are not able to stay hydrated, you have shortness of breath or chest pain.  Thank you for choosing [facility] for your healthcare needs. I hope you feel better soon!

## 2023-04-08 NOTE — ED Provider Notes (Signed)
Ryan Mcmahon    CSN: [id number] Arrival date & time: 04/08/23  4098      History   Chief Complaint Chief Complaint  Patient presents with   Cough   Sore Throat    HPI Ryan Mcmahon is a 62 y.o. male  presents for evaluation of URI symptoms for 3 days. Patient reports associated symptoms of cough, congestion, sore throat. Denies N/V/D, fevers, ear pain, body aches, shortness of breath. Patient does not have a hx of asthma or smoking. No known sick contacts.  Pt has taken Coricidin cough medicine OTC for symptoms.  Reports negative home COVID test today.  Pt has no other concerns at this time.    Cough Associated symptoms: sore throat   Sore Throat    Past Medical History:  Diagnosis Date   History of 2019 novel coronavirus disease (COVID-19) 01/14/2021    Patient Active Problem List   Diagnosis Date Noted   History of colon polyps 12/17/2020   OA (osteoarthritis) of foot 08/30/2020   Chewing tobacco use 02/14/2017   Actinic keratosis 02/08/2016   ED (erectile dysfunction) of organic origin 02/03/2016   Hypogonadism male 02/03/2016   Left hand paresthesia 02/03/2016   Neck pain 02/03/2016   Benign essential tremor 02/03/2016   Anxiety disorder 01/22/2008   Essential (primary) hypertension 05/05/2004   GERD (gastroesophageal reflux disease) 05/05/2002   BPH associated with nocturia 12/04/2001    Past Surgical History:  Procedure Laterality Date   CHOLECYSTECTOMY  01/26/2015   [facility]; Dr. Brigid Re   GALLBLADDER SURGERY  3/16   TONSILLECTOMY AND ADENOIDECTOMY  1960's       Home Medications    Prior to Admission medications   Medication Sig Start Date End Date Taking? Authorizing Provider  promethazine-dextromethorphan (PROMETHAZINE-DM) 6.25-15 MG/5ML syrup Take 5 mLs by mouth 4 (four) times daily as needed for cough. 04/08/23  Yes Radford Pax, NP  ALPRAZolam Prudy Feeler) 0.25 MG tablet TAKE 1/2 TO 1 TABLET(0.125 TO 0.25 MG) BY MOUTH EVERY 8 HOURS AS  NEEDED 11/17/22   Malva Limes, MD  lisinopril-hydrochlorothiazide (ZESTORETIC) 20-25 MG tablet TAKE 1 TABLET BY MOUTH DAILY 04/02/23   Malva Limes, MD  omeprazole (PRILOSEC OTC) 20 MG tablet Take 1 tablet by mouth daily. 10/14/08   [provider]  propranolol ER (INDERAL LA) 80 MG 24 hr capsule TAKE 1 CAPSULE(80 MG) BY MOUTH DAILY 04/05/23   Malva Limes, MD  tadalafil (CIALIS) 5 MG tablet Take 1 tablet (5 mg total) by mouth daily. 03/19/23   Malva Limes, MD    Family History Family History  Problem Relation Age of Onset   Huntington's disease Father    Heart attack Other     Social History Social History   Tobacco Use   Smoking status: Never   Smokeless tobacco: Current    Types: Chew  Vaping Use   Vaping Use: Never used  Substance Use Topics   Alcohol use: Yes    Alcohol/week: 0.0 standard drinks of alcohol    Comment: occasional use   Drug use: No     Allergies   Effexor xr  [venlafaxine] and Tarka  [trandolapril-verapamil hcl er]   Review of Systems Review of Systems  HENT:  Positive for congestion and sore throat.   Respiratory:  Positive for cough.      Physical Exam Triage Vital Signs ED Triage Vitals  Enc Vitals Group     BP 04/08/23 1053 (!) 155/82  Pulse Rate 04/08/23 1053 (!) 54     Resp 04/08/23 1053 18     Temp 04/08/23 1053 98 F (36.7 C)     Temp src --      SpO2 04/08/23 1053 96 %     Weight --      Height --      Head Circumference --      Peak Flow --      Pain Score 04/08/23 1059 8     Pain Loc --      Pain Edu? --      Excl. in GC? --    No data found.  Updated Vital Signs BP (!) 155/82   Pulse (!) 54   Temp 98 F (36.7 C)   Resp 18   SpO2 96%   Visual Acuity Right Eye Distance:   Left Eye Distance:   Bilateral Distance:    Right Eye Near:   Left Eye Near:    Bilateral Near:     Physical Exam Vitals and nursing note reviewed.  Constitutional:      General: He is not in acute distress.     Appearance: Normal appearance. He is not ill-appearing or toxic-appearing.  HENT:     Head: Normocephalic and atraumatic.     Right Ear: Tympanic membrane and ear canal normal.     Left Ear: Tympanic membrane and ear canal normal.     Nose: Congestion present.     Mouth/Throat:     Mouth: Mucous membranes are moist.     Pharynx: Posterior oropharyngeal erythema present.  Eyes:     Pupils: Pupils are equal, round, and reactive to light.  Cardiovascular:     Rate and Rhythm: Normal rate and regular rhythm.     Heart sounds: Normal heart sounds.  Pulmonary:     Effort: Pulmonary effort is normal.     Breath sounds: Normal breath sounds.  Musculoskeletal:     Cervical back: Normal range of motion and neck supple.  Lymphadenopathy:     Cervical: No cervical adenopathy.  Skin:    General: Skin is warm and dry.  Neurological:     General: No focal deficit present.     Mental Status: He is alert and oriented to person, place, and time.  Psychiatric:        Mood and Affect: Mood normal.        Behavior: Behavior normal.      UC Treatments / Results  Labs (all labs ordered are listed, but only abnormal results are displayed) Labs Reviewed  POCT RAPID STREP A (OFFICE)   Comprehensive metabolic panel Order: [id number]    1 Patient Communication          Component Ref Range & Units 2 wk ago 1 yr ago 2 yr ago 3 yr ago 5 yr ago 6 yr ago 7 yr ago  Glucose 70 - 99 mg/dL 829 High  562 High  130 High  R 129 High  R 97 R 107 High  R 108 High  R  BUN 8 - 27 mg/dL 17 17 13 14 13  R 14 R 11 R  Creatinine, Ser 0.76 - 1.27 mg/dL 8.65 7.84 6.96 2.95 2.84 1.16 0.94  eGFR >59 mL/min/1.73 64 66       BUN/Creatinine Ratio 10 - 24 13 14 12 11 13  R 12 R 12 R  Sodium 134 - 144 mmol/L 137 137 138 136 140 140 138  Potassium 3.5 - 5.2  mmol/L 4.7 4.6 4.5 4.5 4.0 4.7 4.2  Chloride 96 - 106 mmol/L 100 97 100 100 102 100 101  CO2 20 - 29 mmol/L 24 24 24 23 23 22  R 21 R  Calcium 8.6 - 10.2  mg/dL 9.3 9.5 9.1 9.0 8.6 Low  R 9.0 R 9.1 R  Total Protein 6.0 - 8.5 g/dL 6.6 7.5 6.9 6.5 6.3 7.0 6.6  Albumin 3.9 - 4.9 g/dL 4.0 4.7 R 4.5 R 4.3 R 4.0 R 4.2 R 4.4 R  Globulin, Total 1.5 - 4.5 g/dL 2.6 2.8 2.4 2.2 2.3 2.8 2.2  Albumin/Globulin Ratio 1.2 - 2.2 1.5 1.7 1.9 2.0 1.7 1.5 2.0 R, CM  Bilirubin Total 0.0 - 1.2 mg/dL 0.6 1.0 0.7 0.8 0.4 0.4 0.5  Alkaline Phosphatase 44 - 121 IU/L 89 88 84 CM 69 R 86 R 87 R 83 R  AST 0 - 40 IU/L 19 21 16 16 17 21 15   ALT 0 - 44 IU/L 15 15 18 14 18 24 19   Resulting Agency [facility]         Narrative Performed byVerdell Carmine Performed at:  [address] [facility] [address] [place], [place]  [id number] Lab Director: Jolene Schimke MD, Phone:  [phone]    Specimen Collected: 03/22/23 07:10 Last Resulted: 03/23/23 05:37            EKG   Radiology No results found.  Procedures Procedures (including critical care time)  Medications Ordered in UC Medications - No data to display  Initial Impression / Assessment and Plan / UC Course  I have reviewed the triage vital signs and the nursing notes.  Pertinent labs & imaging results that were available during my care of the patient were reviewed by me and considered in my medical decision making (see chart for details).     Reviewed recent labs and progress notes Discussed exam and symptoms with patient.  Negative rapid strep Discussed viral illness and symptomatic treatment Promethazine DM as needed for cough Rest and fluids PCP follow-up if symptoms do not improve ER precautions reviewed and patient verbalized understanding Final Clinical Impressions(s) / UC Diagnoses   Final diagnoses:  Viral upper respiratory infection     Discharge Instructions      Your strep test was negative. Please treat your symptoms with over the counter tylenol or ibuprofen, humidifier, and rest.  Promethazine DM as needed for cough.  Please  note this medication will make you drowsy.  Viral illnesses can last 7-14 days. Please follow up with your PCP if your symptoms are not improving. Please go to the ER for any worsening symptoms. This includes but is not limited to fever you can not control with tylenol or ibuprofen, you are not able to stay hydrated, you have shortness of breath or chest pain.  Thank you for choosing [facility] for your healthcare needs. I hope you feel better soon!    ED Prescriptions     Medication Sig Dispense Auth. Provider   promethazine-dextromethorphan (PROMETHAZINE-DM) 6.25-15 MG/5ML syrup Take 5 mLs by mouth 4 (four) times daily as needed for cough. 118 mL Radford Pax, NP      PDMP not reviewed this encounter.   Radford Pax, NP 04/08/23 [phone]

## 2023-04-08 NOTE — ED Triage Notes (Signed)
Patient to Urgent Care with complaints of cough and sore throat. Denies any known fevers.  Symptoms started Friday. Has been taking coricidin cough/ cold meds.  Reports negative Covid home test.

## 2023-06-30 ENCOUNTER — Other Ambulatory Visit: Payer: Self-pay | Admitting: Family Medicine

## 2023-07-02 NOTE — Telephone Encounter (Signed)
Requested medication (s) are due for refill today - yes  Requested medication (s) are on the active medication list -yes  Future visit scheduled -no  Last refill: 11/17/22 #30 3RF  Notes to clinic: non delegated Rx  Requested Prescriptions  Pending Prescriptions Disp Refills   ALPRAZolam (XANAX) 0.25 MG tablet [Pharmacy Med Name: ALPRAZOLAM 0.25MG  TABLETS] 30 tablet     Sig: TAKE 1/2 TO 1 TABLET(0.125 TO 0.25 MG) BY MOUTH EVERY 8 HOURS AS NEEDED     Not Delegated - Psychiatry: Anxiolytics/Hypnotics 2 Failed - 06/30/2023  1:29 PM      Failed - This refill cannot be delegated      Failed - Urine Drug Screen completed in last 360 days      Passed - Patient is not pregnant      Passed - Valid encounter within last 6 months    Recent Outpatient Visits           3 months ago Annual physical exam   [place] [facility] Malva Limes, MD   1 year ago Annual physical exam   [facility] Malva Limes, MD   2 years ago History of 2019 novel coronavirus disease (COVID-19)   [place] [facility] Malva Limes, MD   2 years ago Annual physical exam   [facility] Malva Limes, MD   3 years ago Essential (primary) hypertension   [facility] Malva Limes, MD                 Requested Prescriptions  Pending Prescriptions Disp Refills   ALPRAZolam (XANAX) 0.25 MG tablet [Pharmacy Med Name: ALPRAZOLAM 0.25MG  TABLETS] 30 tablet     Sig: TAKE 1/2 TO 1 TABLET(0.125 TO 0.25 MG) BY MOUTH EVERY 8 HOURS AS NEEDED     Not Delegated - Psychiatry: Anxiolytics/Hypnotics 2 Failed - 06/30/2023  1:29 PM      Failed - This refill cannot be delegated      Failed - Urine Drug Screen completed in last 360 days      Passed - Patient is not pregnant      Passed - Valid encounter within last 6 months    Recent Outpatient Visits           3 months ago Annual  physical exam   [place] [facility] Malva Limes, MD   1 year ago Annual physical exam   [facility] Malva Limes, MD   2 years ago History of 2019 novel coronavirus disease (COVID-19)   [place] [facility] Malva Limes, MD   2 years ago Annual physical exam   [facility] Malva Limes, MD   3 years ago Essential (primary) hypertension   [place] [facility] Malva Limes, MD

## 2023-09-26 DIAGNOSIS — M25561 Pain in right knee: Secondary | ICD-10-CM | POA: Diagnosis not present

## 2023-09-26 DIAGNOSIS — M13861 Other specified arthritis, right knee: Secondary | ICD-10-CM | POA: Diagnosis not present

## 2023-10-19 DIAGNOSIS — M13861 Other specified arthritis, right knee: Secondary | ICD-10-CM | POA: Diagnosis not present

## 2023-10-29 ENCOUNTER — Other Ambulatory Visit: Payer: Self-pay | Admitting: Family Medicine

## 2023-10-30 NOTE — Telephone Encounter (Signed)
Requested medication (s) are due for refill today - yes  Requested medication (s) are on the active medication list -yes  Future visit scheduled -no  Last refill: 07/02/23 #30 1RF  Notes to clinic: non delegated Rx  Requested Prescriptions  Pending Prescriptions Disp Refills   ALPRAZolam (XANAX) 0.25 MG tablet [Pharmacy Med Name: ALPRAZOLAM 0.25MG  TABLETS] 30 tablet     Sig: TAKE 1/2 TO 1 TABLET(0.125 TO 0.25 MG) BY MOUTH EVERY 8 HOURS AS NEEDED     Not Delegated - Psychiatry: Anxiolytics/Hypnotics 2 Failed - 10/29/2023  9:33 PM      Failed - This refill cannot be delegated      Failed - Urine Drug Screen completed in last 360 days      Failed - Valid encounter within last 6 months    Recent Outpatient Visits           7 months ago Annual physical exam   [place] [facility] Malva Limes, MD   1 year ago Annual physical exam   [facility] Malva Limes, MD   2 years ago History of 2019 novel coronavirus disease (COVID-19)   [place] [facility] Sherrie Mustache, Demetrios Isaacs, MD   3 years ago Annual physical exam   [facility] Malva Limes, MD   3 years ago Essential (primary) hypertension   [place] [facility] Malva Limes, MD              Passed - Patient is not pregnant         Requested Prescriptions  Pending Prescriptions Disp Refills   ALPRAZolam (XANAX) 0.25 MG tablet [Pharmacy Med Name: ALPRAZOLAM 0.25MG  TABLETS] 30 tablet     Sig: TAKE 1/2 TO 1 TABLET(0.125 TO 0.25 MG) BY MOUTH EVERY 8 HOURS AS NEEDED     Not Delegated - Psychiatry: Anxiolytics/Hypnotics 2 Failed - 10/29/2023  9:33 PM      Failed - This refill cannot be delegated      Failed - Urine Drug Screen completed in last 360 days      Failed - Valid encounter within last 6 months    Recent Outpatient Visits           7 months ago Annual physical exam   [facility] Malva Limes, MD   1 year ago Annual physical exam   [facility] Malva Limes, MD   2 years ago History of 2019 novel coronavirus disease (COVID-19)   [place] [facility] Malva Limes, MD   3 years ago Annual physical exam   [facility] Malva Limes, MD   3 years ago Essential (primary) hypertension    [facility] Malva Limes, MD              Passed - Patient is not pregnant

## 2023-11-14 DIAGNOSIS — D2261 Melanocytic nevi of right upper limb, including shoulder: Secondary | ICD-10-CM | POA: Diagnosis not present

## 2023-11-14 DIAGNOSIS — D225 Melanocytic nevi of trunk: Secondary | ICD-10-CM | POA: Diagnosis not present

## 2023-11-14 DIAGNOSIS — L570 Actinic keratosis: Secondary | ICD-10-CM | POA: Diagnosis not present

## 2023-11-14 DIAGNOSIS — D2262 Melanocytic nevi of left upper limb, including shoulder: Secondary | ICD-10-CM | POA: Diagnosis not present

## 2023-11-14 DIAGNOSIS — D2272 Melanocytic nevi of left lower limb, including hip: Secondary | ICD-10-CM | POA: Diagnosis not present

## 2023-11-14 DIAGNOSIS — X32XXXA Exposure to sunlight, initial encounter: Secondary | ICD-10-CM | POA: Diagnosis not present

## 2023-12-08 ENCOUNTER — Telehealth: Payer: BC Managed Care – PPO | Admitting: Family Medicine

## 2023-12-08 DIAGNOSIS — J069 Acute upper respiratory infection, unspecified: Secondary | ICD-10-CM | POA: Diagnosis not present

## 2023-12-08 MED ORDER — AZITHROMYCIN 250 MG PO TABS: ORAL_TABLET | ORAL | 0 refills | Status: AC

## 2023-12-08 MED ORDER — BENZONATATE 200 MG PO CAPS: 200.0000 mg | ORAL_CAPSULE | Freq: Two times a day (BID) | ORAL | 0 refills | Status: DC | PRN

## 2023-12-08 NOTE — Progress Notes (Signed)

## 2024-02-08 DIAGNOSIS — H524 Presbyopia: Secondary | ICD-10-CM | POA: Diagnosis not present

## 2024-02-18 ENCOUNTER — Other Ambulatory Visit: Payer: Self-pay | Admitting: Family Medicine

## 2024-02-18 DIAGNOSIS — N401 Enlarged prostate with lower urinary tract symptoms: Secondary | ICD-10-CM

## 2024-02-19 DIAGNOSIS — R208 Other disturbances of skin sensation: Secondary | ICD-10-CM | POA: Diagnosis not present

## 2024-02-19 DIAGNOSIS — D485 Neoplasm of uncertain behavior of skin: Secondary | ICD-10-CM | POA: Diagnosis not present

## 2024-02-19 DIAGNOSIS — C44722 Squamous cell carcinoma of skin of right lower limb, including hip: Secondary | ICD-10-CM | POA: Diagnosis not present

## 2024-02-24 NOTE — Progress Notes (Signed)
 Established patient visit  Patient: Ryan Mcmahon   DOB: 09-24-1961   63 y.o. Male  MRN: [id number] Visit Date: 02/25/2024  Today's healthcare provider: Debera Lat, PA-C   No chief complaint on file.  Subjective       Discussed the use of AI scribe software for clinical note transcription with the patient, who gave verbal consent to proceed.  History of Present Illness        03/19/2023    4:11 PM 01/16/2022    2:08 PM 08/30/2020    2:26 PM  Depression screen PHQ 2/9  Decreased Interest 0 0 0  Down, Depressed, Hopeless 0 0 0  PHQ - 2 Score 0 0 0  Altered sleeping 0 0 0  Tired, decreased energy 1 1 0  Change in appetite 0 1 0  Feeling bad or failure about yourself  0 0 0  Trouble concentrating 0 0 0  Moving slowly or fidgety/restless 0 0 0  Suicidal thoughts 0 0 0  PHQ-9 Score 1 2 0  Difficult doing work/chores Not difficult at all Not difficult at all Not difficult at all       No data to display          Medications: Outpatient Medications Prior to Visit  Medication Sig  . ALPRAZolam (XANAX) 0.25 MG tablet TAKE 1/2 TO 1 TABLET(0.125 TO 0.25 MG) BY MOUTH EVERY 8 HOURS AS NEEDED  . benzonatate (TESSALON) 200 MG capsule Take 1 capsule (200 mg total) by mouth 2 (two) times daily as needed for cough.  Marland Kitchen lisinopril-hydrochlorothiazide (ZESTORETIC) 20-25 MG tablet TAKE 1 TABLET BY MOUTH DAILY  . omeprazole (PRILOSEC OTC) 20 MG tablet Take 1 tablet by mouth daily.  . promethazine-dextromethorphan (PROMETHAZINE-DM) 6.25-15 MG/5ML syrup Take 5 mLs by mouth 4 (four) times daily as needed for cough.  . propranolol ER (INDERAL LA) 80 MG 24 hr capsule TAKE 1 CAPSULE(80 MG) BY MOUTH DAILY  . tadalafil (CIALIS) 5 MG tablet TAKE 1 TABLET(5 MG) BY MOUTH DAILY   No facility-administered medications prior to visit.    Review of Systems  All other systems reviewed and are negative. All negative Except see HPI   {Insert previous labs (optional):23779} {See past labs   Heme  Chem  Endocrine  Serology  Results Review (optional):1}   Objective    There were no vitals taken for this visit. {Insert last BP/Wt (optional):23777}{See vitals history (optional):1}   Physical Exam Vitals reviewed.  Constitutional:      General: He is not in acute distress.    Appearance: Normal appearance. He is not diaphoretic.  HENT:     Head: Normocephalic and atraumatic.  Eyes:     General: No scleral icterus.    Conjunctiva/sclera: Conjunctivae normal.  Cardiovascular:     Rate and Rhythm: Normal rate and regular rhythm.     Pulses: Normal pulses.     Heart sounds: Normal heart sounds. No murmur heard. Pulmonary:     Effort: Pulmonary effort is normal. No respiratory distress.     Breath sounds: Normal breath sounds. No wheezing or rhonchi.  Musculoskeletal:     Cervical back: Neck supple.     Right lower leg: No edema.     Left lower leg: No edema.  Lymphadenopathy:     Cervical: No cervical adenopathy.  Skin:    General: Skin is warm and dry.     Findings: No rash.  Neurological:     Mental Status: He is alert and oriented  to person, place, and time. Mental status is at baseline.  Psychiatric:        Mood and Affect: Mood normal.        Behavior: Behavior normal.     No results found for any visits on 02/25/24.      Assessment and Plan Assessment & Plan     No orders of the defined types were placed in this encounter.   No follow-ups on file.   The patient was advised to call back or seek an in-person evaluation if the symptoms worsen or if the condition fails to improve as anticipated.  I discussed the assessment and treatment plan with the patient. The patient was provided an opportunity to ask questions and all were answered. The patient agreed with the plan and demonstrated an understanding of the instructions.  I, Debera Lat, PA-C have reviewed all documentation for this visit. The documentation on 02/25/2024  for the exam,  diagnosis, procedures, and orders are all accurate and complete.  Debera Lat, [facility], MMS [facility] [phone] (phone) [phone] (fax)  [facility]

## 2024-02-25 ENCOUNTER — Ambulatory Visit: Admitting: Physician Assistant

## 2024-02-25 ENCOUNTER — Encounter: Payer: Self-pay | Admitting: Physician Assistant

## 2024-02-25 VITALS — BP 128/81 | HR 98 | Temp 98.1°F | Resp 16 | Ht 73.0 in | Wt 221.0 lb

## 2024-02-25 DIAGNOSIS — M7989 Other specified soft tissue disorders: Secondary | ICD-10-CM | POA: Diagnosis not present

## 2024-02-26 ENCOUNTER — Ambulatory Visit
Admission: RE | Admit: 2024-02-26 | Discharge: 2024-02-26 | Disposition: A | Discharge status: Home / Self Care | Source: Ambulatory Visit | Attending: Physician Assistant | Admitting: Physician Assistant

## 2024-02-26 DIAGNOSIS — R2231 Localized swelling, mass and lump, right upper limb: Secondary | ICD-10-CM | POA: Diagnosis not present

## 2024-02-26 DIAGNOSIS — M7989 Other specified soft tissue disorders: Secondary | ICD-10-CM | POA: Diagnosis not present

## 2024-03-05 ENCOUNTER — Other Ambulatory Visit: Payer: Self-pay | Admitting: Family Medicine

## 2024-03-07 ENCOUNTER — Ambulatory Visit: Admitting: Family Medicine

## 2024-03-28 ENCOUNTER — Ambulatory Visit: Admitting: Family Medicine

## 2024-03-28 ENCOUNTER — Encounter: Payer: Self-pay | Admitting: Family Medicine

## 2024-03-28 VITALS — BP 113/77 | HR 52 | Ht 73.0 in | Wt 219.5 lb

## 2024-03-28 DIAGNOSIS — Z0000 Encounter for general adult medical examination without abnormal findings: Secondary | ICD-10-CM

## 2024-03-28 DIAGNOSIS — I10 Essential (primary) hypertension: Secondary | ICD-10-CM | POA: Diagnosis not present

## 2024-03-28 DIAGNOSIS — F419 Anxiety disorder, unspecified: Secondary | ICD-10-CM

## 2024-03-28 DIAGNOSIS — G250 Essential tremor: Secondary | ICD-10-CM | POA: Diagnosis not present

## 2024-03-28 DIAGNOSIS — Z23 Encounter for immunization: Secondary | ICD-10-CM

## 2024-03-28 DIAGNOSIS — R739 Hyperglycemia, unspecified: Secondary | ICD-10-CM

## 2024-03-28 DIAGNOSIS — K219 Gastro-esophageal reflux disease without esophagitis: Secondary | ICD-10-CM

## 2024-03-28 DIAGNOSIS — Z125 Encounter for screening for malignant neoplasm of prostate: Secondary | ICD-10-CM

## 2024-03-28 DIAGNOSIS — Z0001 Encounter for general adult medical examination with abnormal findings: Secondary | ICD-10-CM | POA: Diagnosis not present

## 2024-04-02 DIAGNOSIS — R739 Hyperglycemia, unspecified: Secondary | ICD-10-CM | POA: Diagnosis not present

## 2024-04-02 DIAGNOSIS — G250 Essential tremor: Secondary | ICD-10-CM | POA: Diagnosis not present

## 2024-04-02 DIAGNOSIS — I10 Essential (primary) hypertension: Secondary | ICD-10-CM | POA: Diagnosis not present

## 2024-04-02 DIAGNOSIS — Z125 Encounter for screening for malignant neoplasm of prostate: Secondary | ICD-10-CM | POA: Diagnosis not present

## 2024-04-03 LAB — CBC
Hematocrit: 45 % (ref 37.5–51.0)
Hemoglobin: 14.9 g/dL (ref 13.0–17.7)
MCH: 31.1 pg (ref 26.6–33.0)
MCHC: 33.1 g/dL (ref 31.5–35.7)
MCV: 94 fL (ref 79–97)
Platelets: 243 10*3/uL (ref 150–450)
RBC: 4.79 x10E6/uL (ref 4.14–5.80)
RDW: 13.1 % (ref 11.6–15.4)
WBC: 8.8 10*3/uL (ref 3.4–10.8)

## 2024-04-03 LAB — COMPREHENSIVE METABOLIC PANEL WITH GFR
ALT: 15 IU/L (ref 0–44)
AST: 17 IU/L (ref 0–40)
Albumin: 4.4 g/dL (ref 3.9–4.9)
Alkaline Phosphatase: 89 IU/L (ref 44–121)
BUN/Creatinine Ratio: 14 (ref 10–24)
BUN: 16 mg/dL (ref 8–27)
Bilirubin Total: 0.5 mg/dL (ref 0.0–1.2)
CO2: 23 mmol/L (ref 20–29)
Calcium: 8.8 mg/dL (ref 8.6–10.2)
Chloride: 98 mmol/L (ref 96–106)
Creatinine, Ser: 1.14 mg/dL (ref 0.76–1.27)
Globulin, Total: 2.3 g/dL (ref 1.5–4.5)
Glucose: 119 mg/dL — ABNORMAL HIGH (ref 70–99)
Potassium: 4.3 mmol/L (ref 3.5–5.2)
Sodium: 134 mmol/L (ref 134–144)
Total Protein: 6.7 g/dL (ref 6.0–8.5)
eGFR: 72 mL/min/{1.73_m2} (ref >59)

## 2024-04-03 LAB — LIPID PANEL
Chol/HDL Ratio: 4.8 ratio (ref 0.0–5.0)
Cholesterol, Total: 157 mg/dL (ref 100–199)
HDL: 33 mg/dL — ABNORMAL LOW (ref >39)
LDL Chol Calc (NIH): 107 mg/dL — ABNORMAL HIGH (ref 0–99)
Triglycerides: 89 mg/dL (ref 0–149)
VLDL Cholesterol Cal: 17 mg/dL (ref 5–40)

## 2024-04-03 LAB — PSA TOTAL (REFLEX TO FREE): Prostate Specific Ag, Serum: 2.7 ng/mL (ref 0.0–4.0)

## 2024-04-03 LAB — HEMOGLOBIN A1C
Est. average glucose Bld gHb Est-mCnc: 120 mg/dL
Hgb A1c MFr Bld: 5.8 % — ABNORMAL HIGH (ref 4.8–5.6)

## 2024-04-03 LAB — TSH: TSH: 2.41 u[IU]/mL (ref 0.450–4.500)

## 2024-04-08 ENCOUNTER — Encounter: Payer: Self-pay | Admitting: Family Medicine

## 2024-04-20 ENCOUNTER — Other Ambulatory Visit: Payer: Self-pay | Admitting: Family Medicine

## 2024-04-20 DIAGNOSIS — I10 Essential (primary) hypertension: Secondary | ICD-10-CM

## 2024-04-20 DIAGNOSIS — G250 Essential tremor: Secondary | ICD-10-CM

## 2024-04-21 NOTE — Progress Notes (Signed)
 Complete physical exam   Patient: Ryan Mcmahon   DOB: 1961/01/27   63 y.o. Male  MRN: [id number] Visit Date: 03/28/2024  Today's healthcare provider: Jeralene Mom, MD   Chief Complaint  Patient presents with   Annual Exam   Mass    Pt has lump on right shoulder that was scanned, would like to discuss what it was   Subjective    Discussed the use of AI scribe software for clinical note transcription with the patient, who gave verbal consent to proceed.  History of Present Illness   Ryan Mcmahon "Audrene Blessing" is a 63 year old male who presents for an annual physical exam.  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 It is not currently causing any symptoms.  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  He continues on lisinopril -hctz for hypertension and propranolol  ER for essential tremor and hypertension. He takes a half tablet of alprazolam  in the morning, Monday through Friday, while working. 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 There is no impact on daily activities such as eating or writing.  His ears have been feeling 'stocked up' recently, possibly due to fluid or wax buildup, especially after swimming.  No stomach problems or changes in bowel habits. No issues with balance, gait, or family history of Parkinson's disease.       Past Medical History:  Diagnosis Date   History of 2019 novel coronavirus disease (COVID-19) 01/14/2021   Past Surgical History:  Procedure Laterality Date   CHOLECYSTECTOMY  01/26/2015   [facility]; Dr. Murry Art   GALLBLADDER SURGERY  3/16   TONSILLECTOMY AND ADENOIDECTOMY  1960's   Social History   Socioeconomic  History   Marital status: Married    Spouse name: Not on file   Number of children: 2   Years of education: Not on file   Highest education level: Not on file  Occupational History   Not on file  Tobacco Use   Smoking status: Never   Smokeless tobacco: Current    Types: Chew  Vaping Use   Vaping status: Never Used  Substance and Sexual Activity   Alcohol use: Yes    Alcohol/week: 0.0 standard drinks of alcohol    Comment: occasional use   Drug use: No   Sexual activity: Not on file  Other Topics Concern   Not on file  Social History Narrative   Not on file   Social Drivers of Health   Financial Resource Strain: Low Risk  (03/28/2024)   Overall Financial Resource Strain (CARDIA)    Difficulty of Paying Living Expenses: Not hard at all  Food Insecurity: No Food Insecurity (03/28/2024)   Hunger Vital Sign    Worried About Running Out of Food in the Last Year: Never true    Ran Out of Food in the Last Year: Never true  Transportation Needs: No Transportation Needs (03/28/2024)   PRAPARE - Administrator, Civil Service (Medical): No    Lack of Transportation (Non-Medical): No  Physical Activity: Sufficiently Active (03/28/2024)   Exercise Vital Sign    Days of Exercise per Week: 5 days    Minutes of  Exercise per Session: 30 min  Stress: No Stress Concern Present (03/28/2024)   [employer] of Occupational Health - Occupational Stress Questionnaire    Feeling of Stress : Not at all  Social Connections: Moderately Integrated (03/28/2024)   Social Connection and Isolation Panel [NHANES]    Frequency of Communication with Friends and Family: Twice a week    Frequency of Social Gatherings with Friends and Family: Once a week    Attends Religious Services: More than 4 times per year    Active Member of [employer] or Organizations: No    Attends Banker Meetings: Never    Marital Status: Married  Catering manager Violence: Not At Risk (03/28/2024)    Humiliation, Afraid, Rape, and Kick questionnaire    Fear of Current or Ex-Partner: No    Emotionally Abused: No    Physically Abused: No    Sexually Abused: No   Family Status  Relation Name Status   Mother  Alive   Father  Deceased at age 49's       Died from Huntingtons Disease   Sister  Alive   Other grandfather Deceased  No partnership data on file   Family History  Problem Relation Age of Onset   Huntington's disease Father    Heart attack Other    Allergies  Allergen Reactions   Effexor Xr  [Venlafaxine]    Tarka  [Trandolapril-Verapamil  Hcl Er]     Other reaction(s): Constipation, Nausea    Patient Care Team: Lamon Pillow, MD as PCP - General (Family Medicine) Ortho, [facility]   Medications: Outpatient Medications Prior to Visit  Medication Sig   ALPRAZolam  (XANAX ) 0.25 MG tablet TAKE 1/2 TO 1 TABLET(0.125 TO 0.25 MG) BY MOUTH EVERY 8 HOURS AS NEEDED   lisinopril -hydrochlorothiazide  (ZESTORETIC ) 20-25 MG tablet TAKE 1 TABLET BY MOUTH DAILY   omeprazole (PRILOSEC OTC) 20 MG tablet Take 1 tablet by mouth daily.   propranolol  ER (INDERAL  LA) 80 MG 24 hr capsule TAKE 1 CAPSULE(80 MG) BY MOUTH DAILY   tadalafil  (CIALIS ) 5 MG tablet TAKE 1 TABLET(5 MG) BY MOUTH DAILY   [DISCONTINUED] benzonatate  (TESSALON ) 200 MG capsule Take 1 capsule (200 mg total) by mouth 2 (two) times daily as needed for cough.   [DISCONTINUED] promethazine -dextromethorphan (PROMETHAZINE -DM) 6.25-15 MG/5ML syrup Take 5 mLs by mouth 4 (four) times daily as needed for cough.   No facility-administered medications prior to visit.    Review of Systems  Constitutional:  Negative for chills, diaphoresis and fever.  HENT:  Negative for congestion, ear discharge, ear pain, hearing loss, nosebleeds, sore throat and tinnitus.   Eyes:  Negative for photophobia, pain, discharge and redness.  Respiratory:  Negative for cough, shortness of breath, wheezing and stridor.   Cardiovascular:  Negative for chest  pain, palpitations and leg swelling.  Gastrointestinal:  Negative for abdominal pain, blood in stool, constipation, diarrhea, nausea and vomiting.  Endocrine: Negative for polydipsia.  Genitourinary:  Negative for dysuria, flank pain, frequency, hematuria and urgency.  Musculoskeletal:  Negative for back pain, myalgias and neck pain.  Skin:  Negative for rash.  Allergic/Immunologic: Negative for environmental allergies.  Neurological:  Positive for tremors. Negative for dizziness, seizures, weakness and headaches.  Hematological:  Does not bruise/bleed easily.  Psychiatric/Behavioral:  Negative for hallucinations and suicidal ideas. The patient is not nervous/anxious.       Objective    BP 113/77 (BP Location: Left Arm, Patient Position: Sitting, Cuff Size: Normal)   Pulse (!) 52  Ht 6\' 1"  (1.854 m)   Wt 219 lb 8 oz (99.6 kg)   SpO2 99%   BMI 28.96 kg/m    Physical Exam   General Appearance:    Well developed, well nourished male. Alert, cooperative, in no acute distress, appears stated age  Head:    Normocephalic, without obvious abnormality, atraumatic  Eyes:    PERRL, conjunctiva/corneas clear, EOM's intact, fundi    benign, both eyes       Ears:    Normal TM's and external ear canals, both ears  Nose:   Nares normal, septum midline, mucosa normal, no drainage   or sinus tenderness  Throat:   Lips, mucosa, and tongue normal; teeth and gums normal  Neck:   Supple, symmetrical, trachea midline, no adenopathy;       thyroid :  No enlargement/tenderness/nodules; no carotid   bruit or JVD  Back:     Symmetric, no curvature, ROM normal, no CVA tenderness  Lungs:     Clear to auscultation bilaterally, respirations unlabored  Chest wall:    No tenderness or deformity  Heart:    Bradycardic. Normal rhythm. No murmurs, rubs, or gallops.  S1 and S2 normal  Abdomen:     Soft, non-tender, bowel sounds active all four quadrants,    no masses, no organomegaly  Genitalia:    deferred   Rectal:    deferred  Extremities:   All extremities are intact. No cyanosis or edema  Pulses:   2+ and symmetric all extremities  Skin:   Skin color, texture, turgor normal, no rashes or lesions  Lymph nodes:   Cervical, supraclavicular, and axillary nodes normal  Neurologic:   CNII-XII intact. Normal strength, sensation and reflexes      Throughout. Normal walking gain, no cogwheeling.      Last depression screening scores    03/28/2024    3:57 PM 02/25/2024    8:12 AM 03/19/2023    4:11 PM  PHQ 2/9 Scores  PHQ - 2 Score 0 0 0  PHQ- 9 Score 0 0 1   Last fall risk screening    03/28/2024    3:57 PM  Fall Risk   Falls in the past year? 0  Number falls in past yr: 0  Injury with Fall? 0   Last Audit-C alcohol use screening    03/28/2024    3:50 PM  Alcohol Use Disorder Test (AUDIT)  1. How often do you have a drink containing alcohol? 3  2. How many drinks containing alcohol do you have on a typical day when you are drinking? 1  3. How often do you have six or more drinks on one occasion? 1  AUDIT-C Score 5  4. How often during the last year have you found that you were not able to stop drinking once you had started? 0  5. How often during the last year have you failed to do what was normally expected from you because of drinking? 0  6. How often during the last year have you needed a first drink in the morning to get yourself going after a heavy drinking session? 0  7. How often during the last year have you had a feeling of guilt of remorse after drinking? 0  8. How often during the last year have you been unable to remember what happened the night before because you had been drinking? 0  9. Have you or someone else been injured as a result of your drinking? 0  10. Has a relative or friend or a doctor or another health worker been concerned about your drinking or suggested you cut down? 0  Alcohol Use Disorder Identification Test Final Score (AUDIT) 5   A score of 3 or more  in women, and 4 or more in men indicates increased risk for alcohol abuse, EXCEPT if all of the points are from question 1     Assessment & Plan    Routine Health Maintenance and Physical Exam  Exercise Activities and Dietary recommendations  Goals   None     Immunization History  Administered Date(s) Administered   Influenza,inj,Quad PF,6+ Mos 09/14/2018, 09/08/2019, 08/30/2020   Influenza-Unspecified 10/05/2015, 09/12/2017, 09/30/2021   PNEUMOCOCCAL CONJUGATE-20 03/28/2024   Td 12/04/2001   Tdap 07/20/2009, 07/08/2019   Zoster Recombinant(Shingrix ) 07/08/2019, 09/08/2019    Health Maintenance  Topic Date Due   COVID-19 Vaccine (1) Never done   HIV Screening  Never done   INFLUENZA VACCINE  07/04/2024   Colonoscopy  11/16/2027   DTaP/Tdap/Td (4 - Td or Tdap) 07/07/2029   Hepatitis C Screening  Completed   Zoster Vaccines- Shingrix   Completed   Pneumococcal Vaccine 14-31 Years old  Aged Out   HPV VACCINES  Aged Out   Meningococcal B Vaccine  Aged Out    Discussed health benefits of physical activity, and encouraged him to engage in regular exercise appropriate for his age and condition.   2. Need for pneumococcal vaccination  - Pneumococcal conjugate vaccine 20-valent (Prevnar 20)  3. Prostate cancer screening  - PSA Total (Reflex To Free)  4. Benign essential tremor Getting a little worse, but no parkinsonian symptoms.  Continue propranolol  - TSH  5. Primary hypertension Well controlled.  .ccm  - CBC - Comprehensive metabolic panel with GFR - Lipid panel  6. Gastroesophageal reflux disease, unspecified whether esophagitis present Diet controlled. May take occasional OTC acid reducer.   7. Anxiety disorder, unspecified type Continue current alprazolam  regiment.   8. Hyperglycemia  - Hemoglobin A1c        Jeralene Mom, MD  [facility] [phone] (phone) [phone] (fax)  [facility]

## 2024-04-24 ENCOUNTER — Other Ambulatory Visit: Payer: Self-pay | Admitting: Family Medicine

## 2024-05-12 DIAGNOSIS — C44722 Squamous cell carcinoma of skin of right lower limb, including hip: Secondary | ICD-10-CM | POA: Diagnosis not present

## 2024-05-12 DIAGNOSIS — L905 Scar conditions and fibrosis of skin: Secondary | ICD-10-CM | POA: Diagnosis not present

## 2024-06-09 ENCOUNTER — Other Ambulatory Visit: Payer: Self-pay | Admitting: Family Medicine

## 2024-06-09 DIAGNOSIS — I10 Essential (primary) hypertension: Secondary | ICD-10-CM

## 2024-06-09 DIAGNOSIS — G250 Essential tremor: Secondary | ICD-10-CM

## 2024-06-09 NOTE — Telephone Encounter (Unsigned)
 Copied from CRM [id number]. Topic: Clinical - Medication Refill >> Jun 09, 2024 10:26 AM Everette C wrote: Medication: propranolol  ER (INDERAL  LA) 80 MG 24 hr capsule [[id number]]  lisinopril -hydrochlorothiazide  (ZESTORETIC ) 20-25 MG tablet [[id number]]  Has the patient contacted their pharmacy? Yes (Agent: If no, request that the patient contact the pharmacy for the refill. If patient does not wish to contact the pharmacy document the reason why and proceed with request.) (Agent: If yes, when and what did the pharmacy advise?)  This is the patient's preferred pharmacy:  [facility] - [place], [place] - [address] [place] [address] Phone: [phone] Fax: [phone]  Is this the correct pharmacy for this prescription? Yes If no, delete pharmacy and type the correct one.   Has the prescription been filled recently? Yes  Is the patient out of the medication? No  Has the patient been seen for an appointment in the last year OR does the patient have an upcoming appointment? Yes  Can we respond through MyChart? No  Agent: Please be advised that Rx refills may take up to 3 business days. We ask that you follow-up with your pharmacy.

## 2024-06-11 MED ORDER — PROPRANOLOL HCL ER 80 MG PO CP24: ORAL_CAPSULE | ORAL | 1 refills | Status: DC

## 2024-06-11 MED ORDER — LISINOPRIL-HYDROCHLOROTHIAZIDE 20-25 MG PO TABS: 1.0000 | ORAL_TABLET | Freq: Every day | ORAL | 1 refills | Status: DC

## 2024-06-11 NOTE — Telephone Encounter (Signed)
 Requested by patient . Last OV 2 months ago .  Requested Prescriptions  Pending Prescriptions Disp Refills   lisinopril -hydrochlorothiazide  (ZESTORETIC ) 20-25 MG tablet 90 tablet 1    Sig: Take 1 tablet by mouth daily.     Cardiovascular:  ACEI + Diuretic Combos Passed - 06/11/2024 12:02 PM      Passed - Na in normal range and within 180 days    Sodium  Date Value Ref Range Status  04/02/2024 134 134 - 144 mmol/L Final         Passed - K in normal range and within 180 days    Potassium  Date Value Ref Range Status  04/02/2024 4.3 3.5 - 5.2 mmol/L Final         Passed - Cr in normal range and within 180 days    Creatinine, Ser  Date Value Ref Range Status  04/02/2024 1.14 0.76 - 1.27 mg/dL Final         Passed - eGFR is 30 or above and within 180 days    GFR calc Af Amer  Date Value Ref Range Status  08/31/2020 82 >59 mL/min/1.73 Final    Comment:    **[facility] currently reports eGFR in compliance with the current**   recommendations of the [employer]. [facility] will   update reporting as new guidelines are published from the NKF-ASN   Task force.    GFR calc non Af Amer  Date Value Ref Range Status  08/31/2020 71 >59 mL/min/1.73 Final   eGFR  Date Value Ref Range Status  04/02/2024 72 >59 mL/min/1.73 Final         Passed - Patient is not pregnant      Passed - Last BP in normal range    BP Readings from Last 1 Encounters:  03/28/24 113/77         Passed - Valid encounter within last 6 months    Recent Outpatient Visits           2 months ago Annual physical exam   [facility] Gasper Nancyann BRAVO, MD   3 months ago Palpable mass of soft tissue of shoulder   [facility] Mucarabones, Dennard, PA-C               propranolol  ER (INDERAL  LA) 80 MG 24 hr capsule 90 capsule 1    Sig: TAKE 1 CAPSULE(80 MG) BY MOUTH DAILY     Cardiovascular:  Beta Blockers Passed - 06/11/2024 12:02 PM      Passed -  Last BP in normal range    BP Readings from Last 1 Encounters:  03/28/24 113/77         Passed - Last Heart Rate in normal range    Pulse Readings from Last 1 Encounters:  03/28/24 (!) 52         Passed - Valid encounter within last 6 months    Recent Outpatient Visits           2 months ago Annual physical exam   [facility] Gasper Nancyann BRAVO, MD   3 months ago Palpable mass of soft tissue of shoulder   [facility] Bishop, Janna, PA-C

## 2024-06-13 ENCOUNTER — Ambulatory Visit
Admission: RE | Admit: 2024-06-13 | Discharge: 2024-06-13 | Disposition: A | Discharge status: Home / Self Care | Attending: Emergency Medicine | Admitting: Emergency Medicine

## 2024-06-13 VITALS — BP 121/68 | HR 73 | Temp 98.0°F | Resp 18

## 2024-06-13 DIAGNOSIS — J069 Acute upper respiratory infection, unspecified: Secondary | ICD-10-CM

## 2024-06-13 MED ORDER — ALBUTEROL SULFATE HFA 108 (90 BASE) MCG/ACT IN AERS: 1.0000 | INHALATION_SPRAY | Freq: Four times a day (QID) | RESPIRATORY_TRACT | 0 refills | Status: DC | PRN

## 2024-06-13 MED ORDER — AZITHROMYCIN 250 MG PO TABS
250.0000 mg | ORAL_TABLET | Freq: Every day | ORAL | 0 refills | Status: DC
Start: 2024-06-13 — End: 2024-10-06

## 2024-06-13 NOTE — Discharge Instructions (Signed)
Take the Zithromax and use the albuterol inhaler as directed.  Follow up with your primary care provider if your symptoms are not improving.   ° °

## 2024-06-13 NOTE — ED Triage Notes (Signed)
 Patient to Urgent Care with complaints of productive cough/ nasal congestion/ drainage.  Symptoms x8 days.  Meds: claritin/ cough medication/ mucinex/ tessalon 

## 2024-06-13 NOTE — ED Provider Notes (Signed)
 Ryan Mcmahon    CSN: [id number] Arrival date & time: 06/13/24  1552      History   Chief Complaint Chief Complaint  Patient presents with   Cough    Cough and cold for over a week - Entered by patient    HPI Ryan Mcmahon is a 63 y.o. male.  Patient presents with 8-day history of congestion, postnasal drip, cough.  He reports mild discomfort in his right ear and scratchy throat.  No fever or shortness of breath.  He has been treating his symptoms with Delsym and Claritin.  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 Arnett   GALLBLADDER SURGERY  3/16   TONSILLECTOMY AND ADENOIDECTOMY  1960's       Home Medications    Prior to Admission medications   Medication Sig Start Date End Date Taking? Authorizing Provider  albuterol  (VENTOLIN  HFA) 108 (90 Base) MCG/ACT inhaler Inhale 1-2 puffs into the lungs every 6 (six) hours as needed. 06/13/24  Yes Corlis Burnard DEL, NP  azithromycin  (ZITHROMAX ) 250 MG tablet Take 1 tablet (250 mg total) by mouth daily. Take first 2 tablets together, then 1 every day until finished. 06/13/24  Yes Corlis Burnard DEL, NP  ALPRAZolam  (XANAX ) 0.25 MG tablet TAKE 1/2 TO 1 TABLET(0.125 TO 0.25 MG) BY MOUTH EVERY 8 HOURS AS NEEDED  04/25/24   Gasper Nancyann BRAVO, MD  lisinopril -hydrochlorothiazide  (ZESTORETIC ) 20-25 MG tablet Take 1 tablet by mouth daily. 06/11/24   Gasper Nancyann BRAVO, MD  omeprazole (PRILOSEC OTC) 20 MG tablet Take 1 tablet by mouth daily. 10/14/08   [provider]  propranolol  ER (INDERAL  LA) 80 MG 24 hr capsule TAKE 1 CAPSULE(80 MG) BY MOUTH DAILY 06/11/24   Gasper Nancyann BRAVO, MD  tadalafil  (CIALIS ) 5 MG tablet TAKE 1 TABLET(5 MG) BY MOUTH DAILY 02/18/24   Gasper Nancyann BRAVO, MD    Family History Family History  Problem Relation Age of Onset   Huntington's disease Father    Heart attack Other     Social History Social History   Tobacco Use   Smoking status: Never   Smokeless tobacco: Current    Types: Chew  Vaping Use   Vaping status: Never Used  Substance Use Topics   Alcohol use: Yes    Alcohol/week: 0.0 standard drinks of alcohol    Comment: occasional use   Drug use: No     Allergies   Effexor xr  [venlafaxine] and Tarka  [trandolapril-verapamil  hcl er]   Review of Systems Review of Systems  Constitutional:  Negative for chills and fever.  HENT:  Positive for congestion, ear pain, postnasal drip and sore throat.   Respiratory:  Positive for cough. Negative for shortness of breath.      Physical  Exam Triage Vital Signs ED Triage Vitals  Encounter Vitals Group     BP 06/13/24 1609 121/68     Girls Systolic BP Percentile --      Girls Diastolic BP Percentile --      Boys Systolic BP Percentile --      Boys Diastolic BP Percentile --      Pulse Rate 06/13/24 1609 73     Resp 06/13/24 1609 18     Temp 06/13/24 1609 98 F (36.7 C)     Temp src --      SpO2 06/13/24 1609 96 %     Weight --      Height --      Head Circumference --      Peak Flow --      Pain Score 06/13/24 1611 0     Pain Loc --      Pain Education --      Exclude from Growth Chart --    No data found.  Updated Vital Signs BP 121/68   Pulse 73   Temp 98 F (36.7 C)   Resp 18   SpO2 96%    Visual Acuity Right Eye Distance:   Left Eye Distance:   Bilateral Distance:    Right Eye Near:   Left Eye Near:    Bilateral Near:     Physical Exam Constitutional:      General: He is not in acute distress. HENT:     Right Ear: Tympanic membrane normal.     Left Ear: Tympanic membrane normal.     Nose: Rhinorrhea present.     Mouth/Throat:     Mouth: Mucous membranes are moist.     Pharynx: Oropharynx is clear.     Comments: PND Cardiovascular:     Rate and Rhythm: Normal rate and regular rhythm.     Heart sounds: Normal heart sounds.  Pulmonary:     Effort: Pulmonary effort is normal. No respiratory distress.     Breath sounds: Normal breath sounds.  Neurological:     Mental Status: He is alert.      UC Treatments / Results  Labs (all labs ordered are listed, but only abnormal results are displayed) Labs Reviewed - No data to display  EKG   Radiology No results found.  Procedures Procedures (including critical care time)  Medications Ordered in UC Medications - No data to display  Initial Impression / Assessment and Plan / UC Course  I have reviewed the triage vital signs and the nursing notes.  Pertinent labs & imaging results that were available during my care of the patient were reviewed by me and considered in my medical decision making (see chart for details).    Acute upper respiratory infection.  Afebrile and vital signs are stable.  Treating today with Zithromax  and albuterol  inhaler as patient reports these have worked well for him in the past.  Education provided on upper respiratory infection.  Instructed patient to follow-up with his PCP if he is not improving.  ED precautions given.  He agrees to plan of care.  Final Clinical Impressions(s) / UC Diagnoses   Final diagnoses:  Acute upper respiratory infection     Discharge Instructions      Take the Zithromax  and use the albuterol  inhaler as directed.  Follow-up with your primary  care provider if your symptoms are not improving.      ED Prescriptions     Medication Sig Dispense Auth. Provider  albuterol  (VENTOLIN  HFA) 108 (90 Base) MCG/ACT inhaler Inhale 1-2 puffs into the lungs every 6 (six) hours as needed. 18 g Corlis Burnard DEL, NP   azithromycin  (ZITHROMAX ) 250 MG tablet Take 1 tablet (250 mg total) by mouth daily. Take first 2 tablets together, then 1 every day until finished. 6 tablet Corlis Burnard DEL, NP      PDMP not reviewed this encounter.   Corlis Burnard DEL, NP 06/13/24 [phone]

## 2024-07-24 ENCOUNTER — Other Ambulatory Visit: Payer: Self-pay | Admitting: Family Medicine

## 2024-07-24 DIAGNOSIS — N401 Enlarged prostate with lower urinary tract symptoms: Secondary | ICD-10-CM

## 2024-07-24 NOTE — Telephone Encounter (Unsigned)
 Copied from CRM [id number]. Topic: Clinical - Medication Refill >> Jul 24, 2024  3:01 PM Zebedee SAUNDERS wrote: Medication: ALPRAZolam  (XANAX ) 0.25 MG tablet and tadalafil  (CIALIS ) 5 MG tablet Has the patient contacted their pharmacy? Yes (Agent: If no, request that the patient contact the pharmacy for the refill. If patient does not wish to contact the pharmacy document the reason why and proceed with request.) (Agent: If yes, when and what did the pharmacy advise?)  This is the patient's preferred pharmacy:  [facility] - [place], [place] - [address] [place] [address] Phone: [phone] Fax: [phone]  Is this the correct pharmacy for this prescription? Yes If no, delete pharmacy and type the correct one.   Has the prescription been filled recently? Yes  Is the patient out of the medication? Yes  Has the patient been seen for an appointment in the last year OR does the patient have an upcoming appointment? Yes  Can we respond through MyChart? Yes  Agent: Please be advised that Rx refills may take up to 3 business days. We ask that you follow-up with your pharmacy.

## 2024-07-25 MED ORDER — ALPRAZOLAM 0.25 MG PO TABS: ORAL_TABLET | ORAL | 1 refills | Status: DC

## 2024-07-25 MED ORDER — TADALAFIL 5 MG PO TABS: 5.0000 mg | ORAL_TABLET | Freq: Every day | ORAL | 2 refills | Status: AC | PRN

## 2024-07-25 NOTE — Telephone Encounter (Signed)
 Requested Prescriptions  Pending Prescriptions Disp Refills   ALPRAZolam  (XANAX ) 0.25 MG tablet 30 tablet 1     Not Delegated - Psychiatry: Anxiolytics/Hypnotics 2 Failed - 07/25/2024  2:22 PM      Failed - This refill cannot be delegated      Failed - Urine Drug Screen completed in last 360 days      Passed - Patient is not pregnant      Passed - Valid encounter within last 6 months    Recent Outpatient Visits           3 months ago Annual physical exam   [place] [facility] Gasper Nancyann BRAVO, MD   5 months ago Palpable mass of soft tissue of shoulder   [place] [facility] [place], Janna, PA-C               tadalafil  (CIALIS ) 5 MG tablet 30 tablet 2    Sig: Take 1 tablet (5 mg total) by mouth daily as needed for erectile dysfunction.     Urology: Erectile Dysfunction Agents Passed - 07/25/2024  2:22 PM      Passed - AST in normal range and within 360 days    AST  Date Value Ref Range Status  04/02/2024 17 0 - 40 IU/L Final         Passed - ALT in normal range and within 360 days    ALT  Date Value Ref Range Status  04/02/2024 15 0 - 44 IU/L Final         Passed - Last BP in normal range    BP Readings from Last 1 Encounters:  06/13/24 121/68         Passed - Valid encounter within last 12 months    Recent Outpatient Visits           3 months ago Annual physical exam   [facility] Gasper Nancyann BRAVO, MD   5 months ago Palpable mass of soft tissue of shoulder   [facility] [place], Janna, PA-C

## 2024-07-25 NOTE — Telephone Encounter (Signed)
 Requested medications are due for refill today.  yes  Requested medications are on the active medications list.  yes  Last refill. 04/25/2024 #30 1 rf  Future visit scheduled.   No   Notes to clinic.  Refill not delegated.    Requested Prescriptions  Pending Prescriptions Disp Refills   ALPRAZolam  (XANAX ) 0.25 MG tablet 30 tablet 1     Not Delegated - Psychiatry: Anxiolytics/Hypnotics 2 Failed - 07/25/2024  2:23 PM      Failed - This refill cannot be delegated      Failed - Urine Drug Screen completed in last 360 days      Passed - Patient is not pregnant      Passed - Valid encounter within last 6 months    Recent Outpatient Visits           3 months ago Annual physical exam   [place] [facility] Gasper Nancyann BRAVO, MD   5 months ago Palpable mass of soft tissue of shoulder   [place] [facility] [place], [place], PA-C              Signed Prescriptions Disp Refills   tadalafil  (CIALIS ) 5 MG tablet 30 tablet 2    Sig: Take 1 tablet (5 mg total) by mouth daily as needed for erectile dysfunction.     Urology: Erectile Dysfunction Agents Passed - 07/25/2024  2:23 PM      Passed - AST in normal range and within 360 days    AST  Date Value Ref Range Status  04/02/2024 17 0 - 40 IU/L Final         Passed - ALT in normal range and within 360 days    ALT  Date Value Ref Range Status  04/02/2024 15 0 - 44 IU/L Final         Passed - Last BP in normal range    BP Readings from Last 1 Encounters:  06/13/24 121/68         Passed - Valid encounter within last 12 months    Recent Outpatient Visits           3 months ago Annual physical exam   [facility] Gasper Nancyann BRAVO, MD   5 months ago Palpable mass of soft tissue of shoulder   [facility] [place], Janna, PA-C

## 2024-09-25 DIAGNOSIS — L538 Other specified erythematous conditions: Secondary | ICD-10-CM | POA: Diagnosis not present

## 2024-09-25 DIAGNOSIS — L820 Inflamed seborrheic keratosis: Secondary | ICD-10-CM | POA: Diagnosis not present

## 2024-09-25 DIAGNOSIS — B078 Other viral warts: Secondary | ICD-10-CM | POA: Diagnosis not present

## 2024-10-06 ENCOUNTER — Ambulatory Visit
Admission: EM | Admit: 2024-10-06 | Discharge: 2024-10-06 | Disposition: A | Discharge status: Home / Self Care | Attending: Emergency Medicine | Admitting: Emergency Medicine

## 2024-10-06 DIAGNOSIS — J069 Acute upper respiratory infection, unspecified: Secondary | ICD-10-CM

## 2024-10-06 MED ORDER — AZITHROMYCIN 250 MG PO TABS: 250.0000 mg | ORAL_TABLET | Freq: Every day | ORAL | 0 refills | Status: DC

## 2024-10-06 MED ORDER — BENZONATATE 100 MG PO CAPS: 100.0000 mg | ORAL_CAPSULE | Freq: Three times a day (TID) | ORAL | 0 refills | Status: DC

## 2024-10-06 MED ORDER — PROMETHAZINE-DM 6.25-15 MG/5ML PO SYRP: 5.0000 mL | ORAL_SOLUTION | Freq: Every evening | ORAL | 0 refills | Status: DC | PRN

## 2024-10-06 NOTE — ED Triage Notes (Signed)
 Pt reports sneezing and productive cough x 5 days. Taking Claritin, Mucinex and Flonase  to provide some relief. No fevers, body aches, SOB. Had one Tessalon  [place] left and took it.

## 2024-10-06 NOTE — ED Provider Notes (Signed)
 Ryan Mcmahon    CSN: [id number] Arrival date & time: 10/06/24  1326      History   Chief Complaint Chief Complaint  Patient presents with   Cough    HPI Ryan Mcmahon is a 63 y.o. male.   Patient presents for evaluation of sneezing, hoarseness, bilateral ear pressure and a productive cough beginning 5 days ago.  No known sick contacts.  Tolerable to food and liquids.  Has attempted use of Tessalon , Mucinex, Flonase  and Claritin without improvement.  Denies fever, shortness of breath or wheezing.  Blood pressure 137/90, heart rate 52, temperature 98.1, respirations 19, O2 saturation 98% on room air   Past Medical History:  Diagnosis Date   History of 2019 novel coronavirus disease (COVID-19) 01/14/2021   Hypertension     Patient Active Problem List   Diagnosis Date Noted   History of colon polyps 12/17/2020   OA (osteoarthritis) of foot 08/30/2020   Chewing tobacco use 02/14/2017   Actinic keratosis 02/08/2016   ED (erectile dysfunction) of organic origin 02/03/2016   Hypogonadism male 02/03/2016   Left hand paresthesia 02/03/2016   Neck pain 02/03/2016   Benign essential tremor 02/03/2016   Anxiety disorder 01/22/2008   Primary hypertension 05/05/2004   GERD (gastroesophageal reflux disease) 05/05/2002   BPH associated with nocturia 12/04/2001    Past Surgical History:  Procedure Laterality Date   CHOLECYSTECTOMY  01/26/2015   [facility]; Dr. Arnett   GALLBLADDER SURGERY  3/16   TONSILLECTOMY AND ADENOIDECTOMY  1960's       Home Medications    Prior to Admission medications   Medication Sig Start Date End Date Taking? Authorizing Provider  ALPRAZolam  (XANAX ) 0.25 MG tablet TAKE 1/2 TO 1 TABLET(0.125 TO 0.25 MG) BY MOUTH EVERY 8 HOURS AS NEEDED 07/25/24  Yes Gasper Nancyann BRAVO, MD  benzonatate  (TESSALON ) 100 MG capsule Take 1 capsule (100 mg total) by mouth every 8 (eight) hours. 10/06/24  Yes Aritza Brunet R, NP  lisinopril -hydrochlorothiazide   (ZESTORETIC ) 20-25 MG tablet Take 1 tablet by mouth daily. 06/11/24  Yes Gasper Nancyann BRAVO, MD  omeprazole (PRILOSEC OTC) 20 MG tablet Take 1 tablet by mouth daily. 10/14/08  Yes [provider]  promethazine -dextromethorphan (PROMETHAZINE -DM) 6.25-15 MG/5ML syrup Take 5 mLs by mouth at bedtime as needed. 10/06/24  Yes Meline Russaw, Shelba JONELLE, NP  propranolol  ER (INDERAL  LA) 80 MG 24 hr capsule TAKE 1 CAPSULE(80 MG) BY MOUTH DAILY 06/11/24  Yes Gasper Nancyann BRAVO, MD  tadalafil  (CIALIS ) 5 MG tablet Take 1 tablet (5 mg total) by mouth daily as needed for erectile dysfunction. 07/25/24  Yes Gasper Nancyann BRAVO, MD  albuterol  (VENTOLIN  HFA) 108 [phone] Base) MCG/ACT inhaler Inhale 1-2 puffs into the lungs every 6 (six) hours as needed. 06/13/24   Corlis Burnard DEL, NP  azithromycin  (ZITHROMAX ) 250 MG tablet Take 1 tablet (250 mg total) by mouth daily. Take first 2 tablets together, then 1 every day until finished. 10/06/24   Teresa Shelba JONELLE, NP    Family History Family History  Problem Relation Age of Onset   Huntington's disease Father    Heart attack Other     Social History Social History   Tobacco Use   Smoking status: Never   Smokeless tobacco: Current    Types: Chew  Vaping Use   Vaping status: Never Used  Substance Use Topics   Alcohol use: Yes    Alcohol/week: 0.0 standard drinks of alcohol    Comment: occasional use   Drug  use: No     Allergies   Effexor xr  [venlafaxine] and Tarka  [trandolapril-verapamil  hcl er]   Review of Systems Review of Systems   Physical Exam Triage Vital Signs ED Triage Vitals  Encounter Vitals Group     BP --      Girls Systolic BP Percentile --      Girls Diastolic BP Percentile --      Boys Systolic BP Percentile --      Boys Diastolic BP Percentile --      Pulse --      Resp --      Temp --      Temp src --      SpO2 --      Weight 10/06/24 1338 224 lb (101.6 kg)     Height --      Head Circumference --      Peak Flow --      Pain Score  10/06/24 1335 0     Pain Loc --      Pain Education --      Exclude from Growth Chart --    No data found.  Updated Vital Signs Wt 224 lb (101.6 kg)   BMI 29.55 kg/m   Visual Acuity Right Eye Distance:   Left Eye Distance:   Bilateral Distance:    Right Eye Near:   Left Eye Near:    Bilateral Near:     Physical Exam Constitutional:      Appearance: Normal appearance.  HENT:     Right Ear: Tympanic membrane, ear canal and external ear normal.     Left Ear: Tympanic membrane, ear canal and external ear normal.     Nose: Congestion present.     Mouth/Throat:     Pharynx: No posterior oropharyngeal erythema.     Comments: Voice is hoarse Eyes:     Extraocular Movements: Extraocular movements intact.  Cardiovascular:     Rate and Rhythm: Normal rate and regular rhythm.     Pulses: Normal pulses.     Heart sounds: Normal heart sounds.  Pulmonary:     Effort: Pulmonary effort is normal.     Breath sounds: Normal breath sounds.  Neurological:     Mental Status: He is alert and oriented to person, place, and time. Mental status is at baseline.      UC Treatments / Results  Labs (all labs ordered are listed, but only abnormal results are displayed) Labs Reviewed - No data to display  EKG   Radiology No results found.  Procedures Procedures (including critical care time)  Medications Ordered in UC Medications - No data to display  Initial Impression / Assessment and Plan / UC Course  I have reviewed the triage vital signs and the nursing notes.  Pertinent labs & imaging results that were available during my care of the patient were reviewed by me and considered in my medical decision making (see chart for details).  Viral URI with cough  Patient is in no signs of distress nor toxic appearing.  Vital signs are stable.  Low suspicion for pneumonia, pneumothorax or bronchitis and therefore will defer imaging.  Viral testing deferred due to timeline of illness.   Empirically placed on azithromycin  and prescribed Tessalon  and Promethazine  DM for management of cough.May use additional over-the-counter medications as needed for supportive care.  May follow-up with urgent care as needed if symptoms persist or worsen.   Final Clinical Impressions(s) / UC Diagnoses  Final diagnoses:  Viral URI with cough     Discharge Instructions      Your symptoms today are most likely being caused by a virus and should steadily improve in time it can take up to 7 to 10 days before you truly start to see a turnaround however things will get better  Take azithromycin  which is an antibiotic to keep symptoms from worsening  Use Tessalon  pill every 8 hours as needed for cough May use cough syrup for rest    You can take Tylenol and/or Ibuprofen as needed for fever reduction and pain relief.   For cough: honey 1/2 to 1 teaspoon (you can dilute the honey in water or another fluid).  You can also use guaifenesin and dextromethorphan for cough. You can use a humidifier for chest congestion and cough.  If you don't have a humidifier, you can sit in the bathroom with the hot shower running.      For sore throat: try warm salt water gargles, cepacol lozenges, throat spray, warm tea or water with lemon/honey, popsicles or ice, or OTC cold relief medicine for throat discomfort.   For congestion: take a daily anti-histamine like Zyrtec, Claritin, and a oral decongestant, such as pseudoephedrine.  You can also use Flonase  1-2 sprays in each nostril daily.   It is important to stay hydrated: drink plenty of fluids (water, gatorade/powerade/pedialyte, juices, or teas) to keep your throat moisturized and help further relieve irritation/discomfort.    ED Prescriptions     Medication Sig Dispense Auth. Provider   azithromycin  (ZITHROMAX ) 250 MG tablet Take 1 tablet (250 mg total) by mouth daily. Take first 2 tablets together, then 1 every day until finished. 6 tablet Tayra Dawe,  Aubrianna Orchard R, NP   benzonatate  (TESSALON ) 100 MG capsule Take 1 capsule (100 mg total) by mouth every 8 (eight) hours. 21 capsule Yailyn Strack R, NP   promethazine -dextromethorphan (PROMETHAZINE -DM) 6.25-15 MG/5ML syrup Take 5 mLs by mouth at bedtime as needed. 118 mL Damyan Corne, Shelba SAUNDERS, NP      PDMP not reviewed this encounter.   Teresa Shelba SAUNDERS, NP 10/06/24 1406

## 2024-10-06 NOTE — Discharge Instructions (Signed)
 Your symptoms today are most likely being caused by a virus and should steadily improve in time it can take up to 7 to 10 days before you truly start to see a turnaround however things will get better  Take azithromycin  which is an antibiotic to keep symptoms from worsening  Use Tessalon  pill every 8 hours as needed for cough May use cough syrup for rest    You can take Tylenol and/or Ibuprofen as needed for fever reduction and pain relief.   For cough: honey 1/2 to 1 teaspoon (you can dilute the honey in water or another fluid).  You can also use guaifenesin and dextromethorphan for cough. You can use a humidifier for chest congestion and cough.  If you don't have a humidifier, you can sit in the bathroom with the hot shower running.      For sore throat: try warm salt water gargles, cepacol lozenges, throat spray, warm tea or water with lemon/honey, popsicles or ice, or OTC cold relief medicine for throat discomfort.   For congestion: take a daily anti-histamine like Zyrtec, Claritin, and a oral decongestant, such as pseudoephedrine.  You can also use Flonase  1-2 sprays in each nostril daily.   It is important to stay hydrated: drink plenty of fluids (water, gatorade/powerade/pedialyte, juices, or teas) to keep your throat moisturized and help further relieve irritation/discomfort.

## 2024-10-25 ENCOUNTER — Other Ambulatory Visit: Payer: Self-pay | Admitting: Family Medicine

## 2024-10-25 NOTE — Telephone Encounter (Signed)
 Requested medication (s) are due for refill today: yes   Requested medication (s) are on the active medication list: yes   Last refill:  07/25/24 #30 with 1 refill   Future visit scheduled: yes   Notes to clinic:  Please review for refill. Refill not delegated per protocol.     Requested Prescriptions  Pending Prescriptions Disp Refills   ALPRAZolam  (XANAX ) 0.25 MG tablet [Pharmacy Med Name: ALPRAZOLAM  0.25 MG TAB] 30 tablet     Sig: TAKE 1/2 TO 1 TABLET BY MOUTH EVERY 8 HOURS AS NEEDED     Not Delegated - Psychiatry: Anxiolytics/Hypnotics 2 Failed - 10/25/2024 11:58 AM      Failed - This refill cannot be delegated      Failed - Urine Drug Screen completed in last 360 days      Failed - Valid encounter within last 6 months    Recent Outpatient Visits           7 months ago Annual physical exam   [facility] Gasper Nancyann BRAVO, MD   8 months ago Palpable mass of soft tissue of shoulder   [facility] [place], [place], PA-C              Passed - Patient is not pregnant

## 2024-12-09 ENCOUNTER — Other Ambulatory Visit: Payer: Self-pay | Admitting: Family Medicine

## 2024-12-09 DIAGNOSIS — G250 Essential tremor: Secondary | ICD-10-CM

## 2024-12-10 ENCOUNTER — Telehealth: Payer: Self-pay

## 2024-12-10 ENCOUNTER — Other Ambulatory Visit: Payer: Self-pay | Admitting: Family Medicine

## 2024-12-10 DIAGNOSIS — G250 Essential tremor: Secondary | ICD-10-CM

## 2024-12-10 NOTE — Telephone Encounter (Signed)
 Copied from CRM #[id number]. Topic: Clinical - Medication Question >> Dec 10, 2024  1:40 PM Santiya F wrote: Reason for CRM: Patient is calling in because he took an at home COVID test and it was positive. Patient wants to know if his provider can send him in some medication.

## 2024-12-10 NOTE — Telephone Encounter (Signed)
 Recommend virtual urgent care.

## 2024-12-10 NOTE — Telephone Encounter (Signed)
 Copied from CRM #[id number]. Topic: Clinical - Medication Refill >> Dec 10, 2024  1:38 PM Santiya F wrote: Medication: propranolol  ER (INDERAL  LA) 80 MG 24 hr capsule [[id number]]  Has the patient contacted their pharmacy? Yes  (Agent: If yes, when and what did the pharmacy advise?) contact office, patient needs an appointment   This is the patient's preferred pharmacy:  [facility] - [place], [place] - [address] [place] [address] Phone: [phone] Fax: [phone]  Is this the correct pharmacy for this prescription? Yes If no, delete pharmacy and type the correct one.   Has the prescription been filled recently? Yes  Is the patient out of the medication? Yes  Has the patient been seen for an appointment in the last year OR does the patient have an upcoming appointment? Yes  Can we respond through MyChart? Yes  Patient wants to know if he can get another refill until his next appointment.   Agent: Please be advised that Rx refills may take up to 3 business days. We ask that you follow-up with your pharmacy.

## 2024-12-11 ENCOUNTER — Telehealth: Admitting: Emergency Medicine

## 2024-12-11 ENCOUNTER — Encounter: Payer: Self-pay | Admitting: Family Medicine

## 2024-12-11 DIAGNOSIS — U071 COVID-19: Secondary | ICD-10-CM

## 2024-12-11 NOTE — Telephone Encounter (Signed)
 Patient advised.

## 2024-12-11 NOTE — Telephone Encounter (Signed)
 Requested Prescriptions  Refused Prescriptions Disp Refills   propranolol  ER (INDERAL  LA) 80 MG 24 hr capsule 90 capsule 1    Sig: TAKE 1 CAPSULE(80 MG) BY MOUTH DAILY     Cardiovascular:  Beta Blockers Failed - 12/11/2024  1:49 PM      Failed - Valid encounter within last 6 months    Recent Outpatient Visits           8 months ago Annual physical exam   [facility] Shea Gasper Nancyann BRAVO, MD   9 months ago Palpable mass of soft tissue of shoulder   [facility] [place], [place], PA-C              Passed - Last BP in normal range    BP Readings from Last 1 Encounters:  06/13/24 121/68         Passed - Last Heart Rate in normal range    Pulse Readings from Last 1 Encounters:  06/13/24 73

## 2024-12-11 NOTE — Progress Notes (Signed)
 Because you are looking for paxlovid , we recommend that you schedule a Virtual Urgent Care video visit in order for the provider to better assess what is going on.  The provider will be able to give you a more accurate diagnosis and treatment plan if we can more freely discuss your symptoms and with the addition of a virtual examination.   If you change your visit to a video visit, we will bill your insurance (similar to an office visit) and you will not be charged for this e-Visit. You will be able to stay at home and speak with the first available [facility] advanced practice provider. The link to do a video visit is in the drop down Menu tab of your Welcome screen in MyChart.

## 2024-12-12 ENCOUNTER — Telehealth: Admitting: Family Medicine

## 2024-12-12 ENCOUNTER — Other Ambulatory Visit: Payer: Self-pay | Admitting: Family Medicine

## 2024-12-12 DIAGNOSIS — U071 COVID-19: Secondary | ICD-10-CM

## 2024-12-12 DIAGNOSIS — I10 Essential (primary) hypertension: Secondary | ICD-10-CM

## 2024-12-12 MED ORDER — PROMETHAZINE-DM 6.25-15 MG/5ML PO SYRP: 5.0000 mL | ORAL_SOLUTION | Freq: Every evening | ORAL | 0 refills | Status: DC | PRN

## 2024-12-12 MED ORDER — PREDNISONE 20 MG PO TABS: 20.0000 mg | ORAL_TABLET | Freq: Two times a day (BID) | ORAL | 0 refills | Status: AC

## 2024-12-12 MED ORDER — NIRMATRELVIR/RITONAVIR (PAXLOVID)TABLET: 3.0000 | ORAL_TABLET | Freq: Two times a day (BID) | ORAL | 0 refills | Status: AC

## 2024-12-12 NOTE — Progress Notes (Signed)
 " Virtual Visit Consent   NESTA KIMPLE, you are scheduled for a virtual visit with a [facility] provider today. Just as with appointments in the office, your consent must be obtained to participate. Your consent will be active for this visit and any virtual visit you may have with one of our providers in the next 365 days. If you have a MyChart account, a copy of this consent can be sent to you electronically.  As this is a virtual visit, video technology does not allow for your provider to perform a traditional examination. This may limit your provider's ability to fully assess your condition. If your provider identifies any concerns that need to be evaluated in person or the need to arrange testing (such as labs, EKG, etc.), we will make arrangements to do so. Although advances in technology are sophisticated, we cannot ensure that it will always work on either your end or our end. If the connection with a video visit is poor, the visit may have to be switched to a telephone visit. With either a video or telephone visit, we are not always able to ensure that we have a secure connection.  By engaging in this virtual visit, you consent to the provision of healthcare and authorize for your insurance to be billed (if applicable) for the services provided during this visit. Depending on your insurance coverage, you may receive a charge related to this service.  I need to obtain your verbal consent now. Are you willing to proceed with your visit today? Ryan Mcmahon has provided verbal consent on 12/12/2024 for a virtual visit (video or telephone). Loa Lamp, FNP  Date: 12/12/2024 8:41 AM   Virtual Visit via Video Note   I, Loa Lamp, connected with  Ryan Mcmahon  ([id number], Mar 14, 1961) on 12/12/2024 at  8:30 AM EST by a video-enabled telemedicine application and verified that I am speaking with the correct person using two identifiers.  Location: Patient: Virtual Visit Location Patient:  Home Provider: Virtual Visit Location Provider: Home Office   I discussed the limitations of evaluation and management by telemedicine and the availability of in person appointments. The patient expressed understanding and agreed to proceed.    History of Present Illness: Ryan Mcmahon is a 64 y.o. who identifies as a male who was assigned male at birth, and is being seen today for positive covid testing. Sx for 3 days. Cough, no wheezing or sob. No fever. Head congestion. Runny nose. 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  Resting comfortably  at home.  Head is normocephalic, atraumatic.  No labored breathing.  Speech is clear and coherent with logical content.  Patient is alert and oriented at baseline.    Assessment and Plan: 1. COVID (Primary)  Increase fluids, see education, tylenol or ibuprofen as directed, UC as needed.   Follow Up Instructions: I  discussed the assessment and treatment plan with the patient. The patient was provided an opportunity to ask questions and all were answered. The patient agreed with the plan and demonstrated an understanding of the instructions.  A copy of instructions were sent to the patient via MyChart unless otherwise noted below.    The patient was advised to call back or  seek an in-person evaluation if the symptoms worsen or if the condition fails to improve as anticipated.    Raelyn Racette, FNP     [1]  Allergies Allergen Reactions   Effexor Xr  [Venlafaxine]    Tarka  [Trandolapril-Verapamil  Hcl Er]     Other reaction(s): Constipation, Nausea  [2]  Current Outpatient Medications:    nirmatrelvir /ritonavir  (PAXLOVID ) 20 x 150 MG & 10 x 100MG  TABS, Take 3 tablets by mouth 2 (two) times daily for 5 days. (Take nirmatrelvir  150 mg two tablets twice daily for 5 days and ritonavir  100 mg one tablet twice daily for 5 days) Patient GFR is 72, Disp: 30 tablet, Rfl: 0   predniSONE  (DELTASONE ) 20 MG tablet, Take 1 tablet (20 mg total) by mouth 2 (two) times daily with a meal for 5 days., Disp: 10 tablet, Rfl: 0   albuterol  (VENTOLIN  HFA) 108 (90 Base) MCG/ACT inhaler, Inhale 1-2 puffs into the lungs every 6 (six) hours as needed., Disp: 18 g, Rfl: 0   ALPRAZolam  (XANAX ) 0.25 MG tablet, TAKE 1/2 TO 1 TABLET(0.125 TO 0.25 MG) BY MOUTH EVERY 8 HOURS AS NEEDED, Disp: 30 tablet, Rfl: 1   azithromycin  (ZITHROMAX ) 250 MG tablet, Take 1 tablet (250 mg total) by mouth daily. Take first 2 tablets together, then 1 every day until finished., Disp: 6 tablet, Rfl: 0   benzonatate  (TESSALON ) 100 MG capsule, Take 1 capsule (100 mg total) by mouth every 8 (eight) hours., Disp: 21 capsule, Rfl: 0   lisinopril -hydrochlorothiazide  (ZESTORETIC ) 20-25 MG tablet, Take 1 tablet by mouth daily., Disp: 90 tablet, Rfl: 1   omeprazole (PRILOSEC OTC) 20 MG tablet, Take 1 tablet by mouth daily., Disp: , Rfl:    promethazine -dextromethorphan (PROMETHAZINE -DM) 6.25-15 MG/5ML syrup, Take 5 mLs by mouth at bedtime as needed., Disp: 118 mL, Rfl: 0   propranolol  ER (INDERAL  LA) 80 MG 24 hr capsule, TAKE 1 CAPSULE BY MOUTH ONCE DAILY, Disp: 90 capsule, Rfl: 3   tadalafil  (CIALIS ) 5 MG tablet, Take 1 tablet (5 mg total) by mouth daily as needed for erectile dysfunction., Disp: 30 tablet, Rfl: 2  "

## 2024-12-12 NOTE — Patient Instructions (Signed)
 COVID-19: What to Know COVID-19 is an infection caused by a virus called SARS-CoV-2. This type of virus is called a coronavirus. People with COVID-19 may: Have few to no symptoms. Have mild to moderate symptoms that affect their lungs and breathing. Get very sick. What are the causes?  COVID-19 is caused by a virus. This virus may be in the air as droplets or on surfaces. It can spread from an infected person when they cough, sneeze, speak, sing, or breathe. You may become infected if: You breathe in the infected droplets in the air. You touch an object that has the virus on it. What increases the risk? You are at risk of getting COVID-19 if you have been around someone with the infection. You may be more likely to get very sick if: You are 57 years old or older. You have certain medical conditions, such as: Heart disease. Diabetes. Long-term respiratory disease. Cancer. Pregnancy. You are immunocompromised. This means your body can't fight infections easily. You have a disability that makes it hard for you to move around, you have trouble moving, or you can't move at all. What are the signs or symptoms? People may have different symptoms from COVID-19. The symptoms can also be mild to very bad. They often show up in 5-6 days after being infected. But, they can take up to 14 days to appear. Common symptoms are: Cough. Feeling tired. New loss of taste or smell. Fever. Less common symptoms are: Sore throat. Headache. Body or muscle aches. Diarrhea. A skin rash or fingers or toes that are a different color than usual. Red or irritated eyes. Sometimes, COVID-19 does not cause symptoms. How is this diagnosed? COVID-19 can be diagnosed with tests done in the lab or at home. Fluid from your nose, mouth, or lungs will be used to check for the virus. How is this treated? Treatment for COVID-19 depends on how sick you are. Mild symptoms can be treated at home with rest, fluids, and  over-the-counter medicines. very bad symptoms may be treated in a hospital intensive care unit (ICU). If you have symptoms and are at risk of getting very sick, you may be given a medicine that fights viruses. This medicine is called an antiviral. How is this prevented? To protect yourself from COVID-19: Know your risk factors. Get vaccinated. If your body can't fight infections easily, talk to your provider about treatment to help prevent COVID-19. Stay at least about 3 feet (1 meter) away from other people. Wear mask that fits well when: You can't stay at a distance from people. You're in a place with not a lot of air flow. Try to be in open spaces with good air flow when you are in public. Wash your hands often or use an alcohol-based hand sanitizer. Cover your nose and mouth when you cough or sneeze. If you think you have COVID-19 or have been around someone who has it, stay home and away from other people as told by your provider or health officials. Where to find more information To learn more: Go to [employer].no Click Health Topics. Type COVID-19 in the search box. Go to VisitDestination.com.br Click Health Topics. Then click All Topics. Type COVID-19 in the search box. Get help right away if: You have trouble breathing or get short of breath. You have pain or pressure in your chest. You're feeling confused. These symptoms may be an emergency. Get help right away. Call 911. Do not wait to see if the symptoms will go away.  Do not drive yourself to the hospital. This information is not intended to replace advice given to you by your health care provider. Make sure you discuss any questions you have with your health care provider. Document Revised: 08/23/2023 Document Reviewed: 08/15/2023 Elsevier Patient Education  2025 [employer].

## 2025-01-02 ENCOUNTER — Ambulatory Visit (INDEPENDENT_AMBULATORY_CARE_PROVIDER_SITE_OTHER): Admitting: Family Medicine

## 2025-01-02 VITALS — BP 137/76 | HR 47 | Resp 16 | Ht 73.0 in | Wt 226.0 lb

## 2025-01-02 DIAGNOSIS — Z23 Encounter for immunization: Secondary | ICD-10-CM

## 2025-01-02 DIAGNOSIS — G250 Essential tremor: Secondary | ICD-10-CM

## 2025-01-02 DIAGNOSIS — K219 Gastro-esophageal reflux disease without esophagitis: Secondary | ICD-10-CM

## 2025-01-02 DIAGNOSIS — F419 Anxiety disorder, unspecified: Secondary | ICD-10-CM

## 2025-01-02 DIAGNOSIS — R739 Hyperglycemia, unspecified: Secondary | ICD-10-CM

## 2025-01-02 DIAGNOSIS — I10 Essential (primary) hypertension: Secondary | ICD-10-CM

## 2025-01-02 DIAGNOSIS — E291 Testicular hypofunction: Secondary | ICD-10-CM

## 2025-01-02 DIAGNOSIS — Z114 Encounter for screening for human immunodeficiency virus [HIV]: Secondary | ICD-10-CM

## 2025-01-02 DIAGNOSIS — N529 Male erectile dysfunction, unspecified: Secondary | ICD-10-CM

## 2025-01-02 DIAGNOSIS — E7800 Pure hypercholesterolemia, unspecified: Secondary | ICD-10-CM

## 2025-01-02 NOTE — Progress Notes (Signed)
 "     Established patient visit   Patient: Ryan Mcmahon   DOB: [birth year]   64 y.o. Male  MRN: [id number] Visit Date: 01/02/2025  Today's healthcare provider: Nancyann Perry, MD   Chief Complaint  Patient presents with   Medication Refill    Med refill   Subjective    Discussed the use of AI scribe software for clinical note transcription with the patient, who gave verbal consent to proceed.  History of Present Illness   Ryan Mcmahon is a 64 year old male who presents for medication management and follow-up on recent health issues.  He had COVID-19 approximately three weeks ago, with symptoms resembling a cold. He was treated with prednisone  for a persistent cough, which resolved his symptoms.  He has a history of melanoma, with a recent diagnosis of another melanoma on the side of his leg. He previously had a melanoma removed from his back, which was graded as zero.  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 He reports no pain associated with it and is uncertain about the need for removal.  He is currently taking lisinopril , propranolol , alprazolam , and tadalafil . He is concerned about ensuring refills at his new pharmacy, [facility].  He received a high-dose flu vaccine at [facility] due to his age and has also received pneumonia and shingles vaccines in the past.  He now works part-time as a electrical engineer at [employer], which involves a lot of walking.       Medications: Show/hide medication list[1] Review of Systems     Objective    BP 137/76 (BP Location: Left Arm, Patient Position: Sitting, Cuff Size: Large)   Pulse (!) 47   Resp 16   Ht 6' 1 (1.854 m)   Wt 226 lb (102.5 kg)   SpO2 100%   BMI 29.82 kg/m   Physical Exam   General: Appearance:    Well developed, well nourished male in no acute distress  Eyes:    PERRL, conjunctiva/corneas clear, EOM's intact        Lungs:     Clear to auscultation bilaterally, respirations unlabored  Heart:    Bradycardic. Normal rhythm. No murmurs, rubs, or gallops.    MS:   All extremities are intact.    Neurologic:   Awake, alert, oriented x 3. No apparent focal neurological defect.          Assessment & Plan    1. Primary hypertension (Primary) Well controlled on lisinopril -hctz  2. Benign essential tremor Fairly well controlled on current dose of propranolol .   3. Anxiety disorder, unspecified type Doing well with prn alprazolam .   4. ED (erectile dysfunction) of organic origin Doing well with prn tadalafil   Ensure future refills are sent to [facility].   Return in about 3 months (around 04/02/2025) for Welcome to Medicare Exam.     Nancyann Perry, MD  [facility] [phone] (phone) [phone] (fax)  [facility]    [1]  Outpatient Medications Prior to Visit  Medication Sig   ALPRAZolam  (XANAX ) 0.25 MG tablet TAKE 1/2 TO 1 TABLET(0.125 TO 0.25 MG) BY MOUTH EVERY 8 HOURS AS NEEDED   lisinopril -hydrochlorothiazide  (ZESTORETIC ) 20-25 MG tablet TAKE 1 TABLET BY MOUTH DAILY   propranolol  ER (INDERAL  LA) 80 MG 24 hr capsule TAKE 1 CAPSULE BY MOUTH ONCE DAILY   tadalafil  (CIALIS ) 5 MG tablet Take  1 tablet (5 mg total) by mouth daily as needed for erectile dysfunction.   omeprazole (PRILOSEC OTC) 20 MG tablet Take 1 tablet by mouth daily. (Patient not taking: Reported on 01/02/2025)   [DISCONTINUED] albuterol  (VENTOLIN  HFA) 108 (90 Base) MCG/ACT inhaler Inhale 1-2 puffs into the lungs every 6 (six) hours as needed.   [DISCONTINUED] azithromycin  (ZITHROMAX ) 250 MG tablet Take 1 tablet (250 mg total) by mouth daily. Take first 2 tablets together, then 1 every day until finished.   [DISCONTINUED] benzonatate  (TESSALON ) 100 MG capsule Take 1 capsule (100 mg total) by mouth every 8 (eight) hours.   [DISCONTINUED] promethazine -dextromethorphan (PROMETHAZINE -DM)  6.25-15 MG/5ML syrup Take 5 mLs by mouth at bedtime as needed.   No facility-administered medications prior to visit.   "

## 2025-01-02 NOTE — Patient Instructions (Signed)
 [name]  Please review the attached list of medications and notify my office if there are any errors.   . Please bring all of your medications to every appointment so we can make sure that our medication list is the same as yours.

## 2025-01-08 ENCOUNTER — Other Ambulatory Visit: Payer: Self-pay | Admitting: Family Medicine

## 2025-04-03 ENCOUNTER — Encounter: Admitting: Family Medicine
